# Patient Record
Sex: Female | Born: 1965 | Race: Black or African American | Hispanic: No | Marital: Single | Smoking: Current some day smoker
Health system: Southern US, Community
[De-identification: ages and names within clinical notes are randomized; demographics above are authoritative.]

## PROBLEM LIST (undated history)

## (undated) DIAGNOSIS — I10 Essential (primary) hypertension: Secondary | ICD-10-CM

## (undated) HISTORY — DX: Essential (primary) hypertension: I10

## (undated) HISTORY — PX: NO PAST SURGERIES: SHX2092

---

## 2001-11-08 ENCOUNTER — Ambulatory Visit (HOSPITAL_COMMUNITY): Admission: RE | Admit: 2001-11-08 | Discharge: 2001-11-08 | Payer: Self-pay | Admitting: Family Medicine

## 2001-11-08 ENCOUNTER — Encounter: Payer: Self-pay | Admitting: Family Medicine

## 2011-05-27 ENCOUNTER — Emergency Department (HOSPITAL_COMMUNITY)
Admission: EM | Admit: 2011-05-27 | Discharge: 2011-05-27 | Disposition: A | Discharge status: Home / Self Care | Payer: No Typology Code available for payment source | Attending: Emergency Medicine | Admitting: Emergency Medicine

## 2011-05-27 ENCOUNTER — Encounter (HOSPITAL_COMMUNITY): Payer: Self-pay | Admitting: Emergency Medicine

## 2011-05-27 ENCOUNTER — Emergency Department (HOSPITAL_COMMUNITY): Payer: No Typology Code available for payment source

## 2011-05-27 DIAGNOSIS — E041 Nontoxic single thyroid nodule: Secondary | ICD-10-CM | POA: Insufficient documentation

## 2011-05-27 DIAGNOSIS — IMO0001 Reserved for inherently not codable concepts without codable children: Secondary | ICD-10-CM | POA: Insufficient documentation

## 2011-05-27 DIAGNOSIS — M545 Low back pain, unspecified: Secondary | ICD-10-CM | POA: Insufficient documentation

## 2011-05-27 DIAGNOSIS — R42 Dizziness and giddiness: Secondary | ICD-10-CM | POA: Insufficient documentation

## 2011-05-27 DIAGNOSIS — M25559 Pain in unspecified hip: Secondary | ICD-10-CM | POA: Insufficient documentation

## 2011-05-27 DIAGNOSIS — S139XXA Sprain of joints and ligaments of unspecified parts of neck, initial encounter: Secondary | ICD-10-CM | POA: Insufficient documentation

## 2011-05-27 DIAGNOSIS — M25569 Pain in unspecified knee: Secondary | ICD-10-CM | POA: Insufficient documentation

## 2011-05-27 DIAGNOSIS — F411 Generalized anxiety disorder: Secondary | ICD-10-CM | POA: Insufficient documentation

## 2011-05-27 DIAGNOSIS — S161XXA Strain of muscle, fascia and tendon at neck level, initial encounter: Secondary | ICD-10-CM

## 2011-05-27 MED ORDER — IBUPROFEN 800 MG PO TABS: 800.0000 mg | ORAL_TABLET | Freq: Three times a day (TID) | ORAL | Status: DC

## 2011-05-27 MED ORDER — IBUPROFEN 800 MG PO TABS
ORAL_TABLET | ORAL | Status: AC
  Administered 2011-05-27: 800 mg via ORAL
  Filled 2011-05-27: qty 1

## 2011-05-27 MED ORDER — IBUPROFEN 800 MG PO TABS
800.0000 mg | ORAL_TABLET | Freq: Once | ORAL | Status: AC
  Administered 2011-05-27: 800 mg via ORAL

## 2011-05-27 NOTE — ED Notes (Signed)
Patient taken off backboard with help of dr. Manus Gunning and lisa, nt, c spine maintained. c collar still on patient at this time. Patient helped with use of bedpan.

## 2011-05-27 NOTE — ED Notes (Signed)
Patient on backboard and c collar in place at this time.
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
## 2011-05-27 NOTE — Discharge Instructions (Signed)
Cervical Sprain A cervical sprain is an injury in the neck in which the ligaments are stretched or torn. The ligaments are the tissues that hold the bones of the neck (vertebrae) in place.Cervical sprains can range from very mild to very severe. Most cervical sprains get better in 1 to 3 weeks, but it depends on the cause and extent of the injury. Severe cervical sprains can cause the neck vertebrae to be unstable. This can lead to damage of the spinal cord and can result in serious nervous system problems. Your caregiver will determine whether your cervical sprain is mild or severe. CAUSES  Severe cervical sprains may be caused by:  Contact sport injuries (football, rugby, wrestling, hockey, auto racing, gymnastics, diving, martial arts, boxing).   Motor vehicle collisions.   Whiplash injuries. This means the neck is forcefully whipped backward and forward.   Falls.  Mild cervical sprains may be caused by:   Awkward positions, such as cradling a telephone between your ear and shoulder.   Sitting in a chair that does not offer proper support.   Working at a poorly designed computer station.   Activities that require looking up or down for long periods of time.  SYMPTOMS   Pain, soreness, stiffness, or a burning sensation in the front, back, or sides of the neck. This discomfort may develop immediately after injury or it may develop slowly and not begin for 24 hours or more after an injury.   Pain or tenderness directly in the middle of the back of the neck.   Shoulder or upper back pain.   Limited ability to move the neck.   Headache.   Dizziness.   Weakness, numbness, or tingling in the hands or arms.   Muscle spasms.   Difficulty swallowing or chewing.   Tenderness and swelling of the neck.  DIAGNOSIS  Most of the time, your caregiver can diagnose this problem by taking your history and doing a physical exam. Your caregiver will ask about any known problems, such as  arthritis in the neck or a previous neck injury. X-rays may be taken to find out if there are any other problems, such as problems with the bones of the neck. However, an X-ray often does not reveal the full extent of a cervical sprain. Other tests such as a computed tomography (CT) scan or magnetic resonance imaging (MRI) may be needed. TREATMENT  Treatment depends on the severity of the cervical sprain. Mild sprains can be treated with rest, keeping the neck in place (immobilization), and pain medicines. Severe cervical sprains need immediate immobilization and an appointment with an orthopedist or neurosurgeon. Several treatment options are available to help with pain, muscle spasms, and other symptoms. Your caregiver may prescribe:  Medicines, such as pain relievers, numbing medicines, or muscle relaxants.   Physical therapy. This can include stretching exercises, strengthening exercises, and posture training. Exercises and improved posture can help stabilize the neck, strengthen muscles, and help stop symptoms from returning.   A neck collar to be worn for short periods of time. Often, these collars are worn for comfort. However, certain collars may be worn to protect the neck and prevent further worsening of a serious cervical sprain.  HOME CARE INSTRUCTIONS   Put ice on the injured area.   Put ice in a plastic bag.   Place a towel between your skin and the bag.   Leave the ice on for 15 to 20 minutes, 3 to 4 times a day.     Only take over-the-counter or prescription medicines for pain, discomfort, or fever as directed by your caregiver.   Keep all follow-up appointments as directed by your caregiver.   Keep all physical therapy appointments as directed by your caregiver.   If a neck collar is prescribed, wear it as directed by your caregiver.   Do not drive while wearing a neck collar.   Make any needed adjustments to your work station to promote good posture.   Avoid positions  and activities that make your symptoms worse.   Warm up and stretch before being active to help prevent problems.  SEEK MEDICAL CARE IF:   Your pain is not controlled with medicine.   You are unable to decrease your pain medicine over time as planned.   Your activity level is not improving as expected.  SEEK IMMEDIATE MEDICAL CARE IF:   You develop any bleeding, stomach upset, or signs of an allergic reaction to your medicine.   Your symptoms get worse.   You develop new, unexplained symptoms.   You have numbness, tingling, weakness, or paralysis in any part of your body.  MAKE SURE YOU:   Understand these instructions.   Will watch your condition.   Will get help right away if you are not doing well or get worse.  Document Released: 10/23/2006 Document Revised: 12/15/2010 Document Reviewed: 09/28/2010 [facility] Patient Information 2012 [place], [place]. Thyroid Diseases Your thyroid is a butterfly-shaped gland in your neck. It is located just above your collarbone. It is one of your endocrine glands, which make hormones. The thyroid helps set your metabolism. Metabolism is how your body gets energy from the foods you eat.  Millions of people have thyroid diseases. Women experience thyroid problems more often than men. In fact, overactive thyroid problems (hyperthyroidism) occur in 1% of all women. If you have a thyroid disease, your body may use energy more slowly or quickly than it should.  Thyroid problems also include an immune disease where your body reacts against your thyroid gland (called thyroiditis). A different problem involves lumps and bumps (called nodules) that develop in the gland. The nodules are usually, but not always, noncancerous. THE MOST COMMON THYROID PROBLEMS AND CAUSES ARE DISCUSSED BELOW There are many causes for thyroid problems. Treatment depends upon the exact diagnosis and includes trying to reset your body's metabolism to a normal  rate. Hyperthyroidism Too much thyroid hormone from an overactive thyroid gland is called hyperthyroidism. In hyperthyroidism, the body's metabolism speeds up. One of the most frequent forms of hyperthyroidism is known as Graves' disease. Graves' disease tends to run in families. Although Graves' is thought to be caused by a problem with the immune system, the exact nature of the genetic problem is unknown. Hypothyroidism Too little thyroid hormone from an underactive thyroid gland is called hypothyroidism. In hypothyroidism, the body's metabolism is slowed. Several things can cause this condition. Most causes affect the thyroid gland directly and hurt its ability to make enough hormone.  Rarely, there may be a pituitary gland tumor (located near the base of the brain). The tumor can block the pituitary from producing thyroid-stimulating hormone (TSH). Your body makes TSH to stimulate the thyroid to work properly. If the pituitary does not make enough TSH, the thyroid fails to make enough hormones needed for good health. Whether the problem is caused by thyroid conditions or by the pituitary gland, the result is that the thyroid is not making enough hormones. Hypothyroidism causes many physical and mental processes to become  sluggish. The body consumes less oxygen and produces less body heat. Thyroid Nodules A thyroid nodule is a small swelling or lump in the thyroid gland. They are common. These nodules represent either a growth of thyroid tissue or a fluid-filled cyst. Both form a lump in the thyroid gland. Almost half of all people will have tiny thyroid nodules at some point in their lives. Typically, these are not noticeable until they become large and affect normal thyroid size. Larger nodules that are greater than a half inch across (about 1 centimeter) occur in about 5 percent of people. Although most nodules are not cancerous, people who have them should seek medical care to rule out cancer. Also,  some thyroid nodules may produce too much thyroid hormone or become too large. Large nodules or a large gland can interfere with breathing or swallowing or may cause neck discomfort. Other problems Other thyroid problems include cancer and thyroiditis. Thyroiditis is a malfunction of the body's immune system. Normally, the immune system works to defend the body against infection and other problems. When the immune system is not working properly, it may mistakenly attack normal cells, tissues, and organs. Examples of autoimmune diseases are Hashimoto's thyroiditis (which causes low thyroid function) and Graves' disease (which causes excess thyroid function). SYMPTOMS  Symptoms vary greatly depending upon the exact type of problem with the thyroid. Hyperthyroidism-is when your thyroid is too active and makes more thyroid hormone than your body needs. The most common cause is Graves' Disease. Too much thyroid hormone can cause some or all of the following symptoms:  Anxiety.   Irritability.   Difficulty sleeping.   Fatigue.   A rapid or irregular heartbeat.   A fine tremor of your hands or fingers.   An increase in perspiration.   Sensitivity to heat.   Weight loss, despite normal food intake.   Brittle hair.   Enlargement of your thyroid gland (goiter).   Light menstrual periods.   Frequent bowel movements.  Graves' disease can specifically cause eye and skin problems. The skin problems involve reddening and swelling of the skin, often on your shins and on the top of your feet. Eye problems can include the following:  Excess tearing and sensation of grit or sand in either or both eyes.   Reddened or inflamed eyes.   Widening of the space between your eyelids.   Swelling of the lids and tissues around the eyes.   Light sensitivity.   Ulcers on the cornea.   Double vision.   Limited eye movements.   Blurred or reduced vision.  Hypothyroidism- is when your thyroid gland  is not active enough. This is more common than hyperthyroidism. Symptoms can vary a lot depending of the severity of the hormone deficiency. Symptoms may develop over a long period of time and can include several of the following:  Fatigue.   Sluggishness.   Increased sensitivity to cold.   Constipation.   Pale, dry skin.   A puffy face.   Hoarse voice.   High blood cholesterol level.   Unexplained weight gain.   Muscle aches, tenderness and stiffness.   Pain, stiffness or swelling in your joints.   Muscle weakness.   Heavier than normal menstrual periods.   Brittle fingernails and hair.   Depression.  Thyroid Nodules - most do not cause signs or symptoms. Occasionally, some may become so large that you can feel or even see the swelling at the base of your neck. You may realize a  lump or swelling is there when you are shaving or putting on makeup. Men might become aware of a nodule when shirt collars suddenly feel too tight. Some nodules produce too much thyroid hormone. This can produce the same symptoms as hyperthyroidism (see above). Thyroid nodules are seldom cancerous. However, a nodule is more likely to be malignant (cancerous) if it:  Grows quickly or feels hard.   Causes you to become hoarse or to have trouble swallowing or breathing.   Causes enlarged lymph nodes under your jaw or in your neck.  DIAGNOSIS  Because there are so many possible thyroid conditions, your caregiver may ask for a number of tests. They will do this in order to narrow down the exact diagnosis. These tests can include:  Blood and antibody tests.   Special thyroid scans using small, safe amounts of radioactive iodine.   Ultrasound of the thyroid gland (particularly if there is a nodule or lump).   Biopsy. This is usually done with a special needle. A needle biopsy is a procedure to obtain a sample of cells from the thyroid. The tissue will be tested in a lab and examined under a  microscope.  TREATMENT  Treatment depends on the exact diagnosis. Hyperthyroidism  Beta-blockers help relieve many of the symptoms.   Anti-thyroid medications prevent the thyroid from making excess hormones.   Radioactive iodine treatment can destroy overactive thyroid cells. The iodine can permanently decrease the amount of hormone produced.   Surgery to remove the thyroid gland.   Treatments for eye problems that come from Graves' disease also include medications and special eye surgery, if felt to be appropriate.  Hypothyroidism Thyroid replacement with levothyroxine is the mainstay of treatment. Treatment with thyroid replacement is usually lifelong and will require monitoring and adjustment from time to time. Thyroid Nodules  Watchful waiting. If a small nodule causes no symptoms or signs of cancer on biopsy, then no treatment may be chosen at first. Re-exam and re-checking blood tests would be the recommended follow-up.   Anti-thyroid medications or radioactive iodine treatment may be recommended if the nodules produce too much thyroid hormone (see Treatment for Hyperthyroidism above).   Alcohol ablation. Injections of small amounts of ethyl alcohol (ethanol) can cause a non-cancerous nodule to shrink in size.   Surgery (see Treatment for Hyperthyroidism above).  HOME CARE INSTRUCTIONS   Take medications as instructed.   Follow through on recommended testing.  SEEK MEDICAL CARE IF:   You feel that you are developing symptoms of Hyperthyroidism or Hypothyroidism as described above.   You develop a new lump/nodule in the neck/thyroid area that you had not noticed before.   You feel that you are having side effects from medicines prescribed.   You develop trouble breathing or swallowing.  SEEK IMMEDIATE MEDICAL CARE IF:   You develop a fever of 102 F (38.9 C) or higher.   You develop severe sweating.   You develop palpitations and/or rapid heart beat.   You develop  shortness of breath.   You develop nausea and vomiting.   You develop extreme shakiness.   You develop agitation.   You develop lightheadedness or have a fainting episode.  Document Released: 10/23/2006 Document Revised: 12/15/2010 Document Reviewed: 10/23/2006 [facility] Patient Information 2012 [place], [place].

## 2011-05-27 NOTE — ED Notes (Signed)
Patient states that she is going through menopause and denies being sexually active.
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
## 2011-06-02 ENCOUNTER — Encounter (HOSPITAL_COMMUNITY): Payer: Self-pay | Admitting: *Deleted

## 2011-06-02 ENCOUNTER — Emergency Department (HOSPITAL_COMMUNITY)
Admission: EM | Admit: 2011-06-02 | Discharge: 2011-06-02 | Disposition: A | Discharge status: Home / Self Care | Payer: No Typology Code available for payment source | Attending: Emergency Medicine | Admitting: Emergency Medicine

## 2011-06-02 DIAGNOSIS — M549 Dorsalgia, unspecified: Secondary | ICD-10-CM

## 2011-06-02 DIAGNOSIS — M545 Low back pain, unspecified: Secondary | ICD-10-CM | POA: Insufficient documentation

## 2011-06-02 MED ORDER — CYCLOBENZAPRINE HCL 10 MG PO TABS: 10.0000 mg | ORAL_TABLET | Freq: Three times a day (TID) | ORAL | Status: AC | PRN

## 2011-06-02 MED ORDER — HYDROCODONE-ACETAMINOPHEN 5-325 MG PO TABS: ORAL_TABLET | ORAL | Status: AC
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
## 2011-06-02 NOTE — ED Provider Notes (Signed)
History     CSN: [id number]  Arrival date & time 06/02/11  1447   First MD Initiated Contact with Patient 06/02/11 1524      Chief Complaint  Patient presents with  . Back Pain    (Consider location/radiation/quality/duration/timing/severity/associated sxs/prior treatment) Patient is a 46 y.o. female presenting with back pain. The history is provided by the patient.  Back Pain  This is a chronic problem. The current episode started more than 2 days ago. The problem occurs constantly. The problem has been gradually worsening. The pain is associated with an MCA. The pain is present in the lumbar spine. The quality of the pain is described as shooting and aching. The pain radiates to the left thigh. The pain is moderate. The symptoms are aggravated by bending, twisting and certain positions. The pain is the same all the time. Associated symptoms include leg pain. Pertinent negatives include no chest pain, no fever, no numbness, no abdominal pain, no abdominal swelling, no bowel incontinence, no perianal numbness, no bladder incontinence, no dysuria, no pelvic pain, no paresthesias, no paresis, no tingling and no weakness. She has tried analgesics for the symptoms. The treatment provided mild relief.    History reviewed. No pertinent past medical history.  History reviewed. No pertinent past surgical history.  No family history on file.  History  Substance Use Topics  . Smoking status: Current Everyday Smoker    Types: Cigarettes  . Smokeless tobacco: Not on file  . Alcohol Use: Yes    OB History    Grav Para Term Preterm Abortions TAB SAB Ect Mult Living                  Review of Systems  Constitutional: Negative for fever.  Respiratory: Negative for chest tightness and shortness of breath.   Cardiovascular: Negative for chest pain.  Gastrointestinal: Negative for vomiting, abdominal pain, constipation and bowel incontinence.  Genitourinary: Negative for bladder  incontinence, dysuria, hematuria, flank pain, decreased urine volume, difficulty urinating and pelvic pain.       Perineal numbness or incontinence of urine or feces  Musculoskeletal: Positive for back pain. Negative for joint swelling.  Skin: Negative for rash.  Neurological: Negative for dizziness, tingling, weakness, numbness and paresthesias.  All other systems reviewed and are negative.    Allergies  Review of patient's allergies indicates no known allergies.  Home Medications   Current Outpatient Rx  Name Route Sig Dispense Refill  . B-12 PO Oral Take 1 tablet by mouth daily.    Marland Kitchen FERROUS SULFATE 325 (65 FE) MG PO TABS Oral Take 325 mg by mouth daily.    . IBUPROFEN 200 MG PO TABS Oral Take 400 mg by mouth as needed. For pain      BP 119/88  Pulse 74  Temp(Src) 97.9 F (36.6 C) (Oral)  Resp 20  Ht 5\' 5"  (1.651 m)  Wt 120 lb (54.432 kg)  BMI 19.97 kg/m2  SpO2 100%  Physical Exam  Nursing note and vitals reviewed. Constitutional: She is oriented to person, place, and time. She appears well-developed and well-nourished. No distress.  HENT:  Head: Normocephalic and atraumatic.  Neck: Normal range of motion. Neck supple.  Cardiovascular: Normal rate, regular rhythm and intact distal pulses.   No murmur heard. Pulmonary/Chest: Effort normal and breath sounds normal.  Musculoskeletal: She exhibits tenderness. She exhibits no edema.       Lumbar back: She exhibits tenderness and pain. She exhibits normal range of motion, no  swelling, no deformity, no laceration and normal pulse.       Back:  Neurological: She is alert and oriented to person, place, and time. No cranial nerve deficit or sensory deficit. She exhibits normal muscle tone. Coordination and gait normal.  Reflex Scores:      Patellar reflexes are 2+ on the right side and 2+ on the left side.      Achilles reflexes are 2+ on the right side and 2+ on the left side. Skin: Skin is warm and dry.    ED Course    Procedures (including critical care time)       MDM     Previous medical charts, nursing notes and vitals signs from this visit were reviewed by me   All laboratory results and/or imaging results performed on this visit, if applicable, were reviewed by me and discussed with the patient and/or parent as well as recommendation for follow-up    MEDICATIONS GIVEN IN ED:  norco po, flexeril   Patient has ttp of the left lumbar and thoracic paraspinal muscles and left SI joint.  No focal neuro deficits on exam.  Ambulates with a steady gait.   Previous x-rays were reviewed by me.      PRESCRIPTIONS GIVEN AT DISCHARGE:  norco #20, flexeril   Pt stable in ED with no significant deterioration in condition. Pt feels improved after observation and/or treatment in ED. Patient / Family / Caregiver understand and agree with initial ED impression and plan with expectations set for ED visit.  Patient agrees to return to ED for any worsening symptoms       Rhyker Silversmith L. Naythen Heikkila, [place] 06/08/11 1344

## 2011-06-02 NOTE — Discharge Instructions (Signed)

## 2011-06-08 NOTE — ED Provider Notes (Signed)
Medical screening examination/treatment/procedure(s) were performed by non-physician practitioner and as supervising physician I was immediately available for consultation/collaboration.   Dayton Bailiff, MD 06/08/11 2005

## 2011-08-28 ENCOUNTER — Encounter (HOSPITAL_COMMUNITY): Payer: Self-pay | Admitting: *Deleted

## 2011-08-28 ENCOUNTER — Emergency Department (HOSPITAL_COMMUNITY)
Admission: EM | Admit: 2011-08-28 | Discharge: 2011-08-28 | Disposition: A | Discharge status: Home / Self Care | Payer: Self-pay | Attending: Emergency Medicine | Admitting: Emergency Medicine

## 2011-08-28 ENCOUNTER — Emergency Department (HOSPITAL_COMMUNITY): Payer: Self-pay

## 2011-08-28 DIAGNOSIS — S93409A Sprain of unspecified ligament of unspecified ankle, initial encounter: Secondary | ICD-10-CM | POA: Insufficient documentation

## 2011-08-28 DIAGNOSIS — F17200 Nicotine dependence, unspecified, uncomplicated: Secondary | ICD-10-CM | POA: Insufficient documentation

## 2011-08-28 DIAGNOSIS — X500XXA Overexertion from strenuous movement or load, initial encounter: Secondary | ICD-10-CM | POA: Insufficient documentation

## 2011-08-28 MED ORDER — IBUPROFEN 600 MG PO TABS: 600.0000 mg | ORAL_TABLET | Freq: Three times a day (TID) | ORAL | Status: AC | PRN

## 2011-08-28 MED ORDER — HYDROCODONE-ACETAMINOPHEN 5-325 MG PO TABS: 1.0000 | ORAL_TABLET | ORAL | Status: AC | PRN

## 2011-08-28 MED ORDER — HYDROCODONE-ACETAMINOPHEN 5-325 MG PO TABS
1.0000 | ORAL_TABLET | Freq: Once | ORAL | Status: AC
  Administered 2011-08-28: 1 via ORAL
  Filled 2011-08-28: qty 1

## 2011-08-28 NOTE — ED Notes (Signed)
Pain rt ankle , twisted ankle when stepping off porch.  Swelling present

## 2011-08-28 NOTE — ED Notes (Signed)
Pt twisted right ankle yesterday while stepping onto patio porch, noted swelling to right ankle

## 2011-08-29 ENCOUNTER — Emergency Department (HOSPITAL_COMMUNITY)
Admission: EM | Admit: 2011-08-29 | Discharge: 2011-08-29 | Disposition: A | Discharge status: Home / Self Care | Payer: Self-pay | Attending: Orthopaedic Surgery | Admitting: Orthopaedic Surgery

## 2011-08-29 ENCOUNTER — Encounter (HOSPITAL_COMMUNITY): Payer: Self-pay | Admitting: *Deleted

## 2011-08-29 DIAGNOSIS — S8263XA Displaced fracture of lateral malleolus of unspecified fibula, initial encounter for closed fracture: Secondary | ICD-10-CM | POA: Insufficient documentation

## 2011-08-29 DIAGNOSIS — X58XXXA Exposure to other specified factors, initial encounter: Secondary | ICD-10-CM | POA: Insufficient documentation

## 2011-08-29 NOTE — ED Notes (Signed)
Dr. Hilda Lias assessed pt and is typing d/c instructions.

## 2011-08-29 NOTE — Consult Note (Signed)
Reason for Consult:Fracture of the right ankle Referring Physician: ER  Courtney Valdez is an 46 y.o. female.  HPI: She fell and hurt her right lateral ankle. X-rays show a small avulsion fracture of the distal lateral malleolus. She has no other injury. She has a CAM walker and crutches.  She was given pain medicine before.    History reviewed. No pertinent past medical history.  History reviewed. No pertinent past surgical history.  History reviewed. No pertinent family history.  Social History:  reports that she has been smoking Cigarettes.  She does not have any smokeless tobacco history on file. She reports that she drinks alcohol. She reports that she uses illicit drugs (Marijuana).  Allergies: No Known Allergies  Medications: I have reviewed the patient's current medications.  No results found for this or any previous visit (from the past 48 hour(s)).  Dg Ankle Complete Right  08/28/2011  *RADIOLOGY REPORT*  Clinical Data: Lateral pain and swelling, rolled ankle yesterday  RIGHT ANKLE - COMPLETE 3+ VIEW  Comparison: None  Findings: Significant soft tissue swelling right ankle. Minimally displaced fracture at tip of lateral malleolus. No additional fracture, dislocation, or bone destruction. Ankle mortise intact.  IMPRESSION: Minimally displaced fracture at tip of lateral malleolus.   Original Report Authenticated By: Lollie Marrow, M.D. ( 08/28/2011 11:25:46 )     Review of Systems  Musculoskeletal: Positive for joint pain (right ankle pain laterally.) and falls.  All other systems reviewed and are negative.   Blood pressure 131/74, pulse 56, temperature 98.4 F (36.9 C), temperature source Oral, resp. rate 18, height 5\' 5"  (1.651 m), weight 56.7 kg (125 lb), SpO2 100.00%. Physical Exam  Constitutional: She is oriented to person, place, and time. She appears well-developed and well-nourished.  HENT:  Head: Normocephalic and atraumatic.  Eyes: Conjunctivae are normal. Pupils  are equal, round, and reactive to light.  Neck: Normal range of motion. Neck supple.  Cardiovascular: Normal rate, regular rhythm, normal heart sounds and intact distal pulses.   Respiratory: Effort normal and breath sounds normal.  GI: Soft. Bowel sounds are normal.  Musculoskeletal: She exhibits edema and tenderness (pain right lateral ankle with lateral ecchymosis and decreased motion.  The ankle is stable.).       Feet:  Neurological: She is alert and oriented to person, place, and time. She has normal reflexes.  Skin: Skin is warm.  Psychiatric: She has a normal mood and affect. Her behavior is normal. Judgment and thought content normal.    Assessment/Plan: Avulsion fracture of the right lateral malleolus.  Continue the CAM walker and crutches and soaking.  See in office next week.  Courtney Valdez 08/29/2011, 3:44 PM

## 2011-08-29 NOTE — ED Notes (Signed)
Has injury to rt ankle . Told to come to ER to see Dr Hilda Lias.

## 2011-08-29 NOTE — ED Notes (Signed)
Pt reports was evaluated here yesterday and was told had fractured ankle.  Pt was told to come here today by Dr. Hilda Lias and he will see her in ED.  Notified Dr. Sanjuan Dame office staff that pt is here.  Pt has palpable pedal pulse, r ankle and foot swollen and bruised

## 2011-08-29 NOTE — ED Notes (Signed)
Phoned Dr. Sanjuan Dame office to see when Dr. Hilda Lias would be in to see pt. Nurse stated he would probably be over in next 30 minutes.

## 2011-08-30 NOTE — ED Provider Notes (Signed)
History     CSN: [id number]  Arrival date & time 08/28/11  1042   First MD Initiated Contact with Patient 08/28/11 1107      Chief Complaint  Patient presents with  . Ankle Pain    (Consider location/radiation/quality/duration/timing/severity/associated sxs/prior treatment) HPI Comments: Courtney Valdez presents with increased pain, swelling and bruising along her right lateral ankle since yesterday after inverting her ankle when stepping onto her porch.  She has been able to weight bear but with discomfort.  She has used ice and elevation along with ibuprofen with no relief of her symptoms.  The history is provided by the patient.    History reviewed. No pertinent past medical history.  History reviewed. No pertinent past surgical history.  History reviewed. No pertinent family history.  History  Substance Use Topics  . Smoking status: Current Everyday Smoker    Types: Cigarettes  . Smokeless tobacco: Not on file  . Alcohol Use: Yes    OB History    Grav Para Term Preterm Abortions TAB SAB Ect Mult Living                  Review of Systems  Musculoskeletal: Positive for joint swelling and arthralgias.  Skin: Negative for wound.  Neurological: Negative for weakness and numbness.    Allergies  Review of patient's allergies indicates no known allergies.  Home Medications   Current Outpatient Rx  Name Route Sig Dispense Refill  . B-12 PO Oral Take 1 tablet by mouth daily.    Marland Kitchen FERROUS SULFATE 325 (65 FE) MG PO TABS Oral Take 325 mg by mouth daily.    . IBUPROFEN 200 MG PO TABS Oral Take 400 mg by mouth as needed. For pain    . HYDROCODONE-ACETAMINOPHEN 5-325 MG PO TABS Oral Take 1 tablet by mouth every 4 (four) hours as needed for pain. 15 tablet 0  . IBUPROFEN 600 MG PO TABS Oral Take 1 tablet (600 mg total) by mouth every 8 (eight) hours as needed for pain. 20 tablet 0    BP 116/102  Pulse 75  Temp 98.3 F (36.8 C) (Oral)  Resp 20  Ht 5\' 5"  (1.651 m)  Wt  125 lb (56.7 kg)  BMI 20.80 kg/m2  SpO2 100%  Physical Exam  Nursing note and vitals reviewed. Constitutional: She appears well-developed and well-nourished.  HENT:  Head: Normocephalic.  Neck: Neck supple.  Cardiovascular: Normal rate and intact distal pulses.  Exam reveals no decreased pulses.   Pulses:      Dorsalis pedis pulses are 2+ on the right side, and 2+ on the left side.       Posterior tibial pulses are 2+ on the right side, and 2+ on the left side.  Musculoskeletal: She exhibits edema and tenderness.       Right ankle: She exhibits decreased range of motion, swelling and ecchymosis. She exhibits normal pulse. tenderness. Lateral malleolus tenderness found. No head of 5th metatarsal and no proximal fibula tenderness found. Achilles tendon normal.  Neurological: She is alert. No sensory deficit.  Skin: Skin is warm, dry and intact.    ED Course  Procedures (including critical care time)  Labs Reviewed - No data to display Dg Ankle Complete Right  08/28/2011  *RADIOLOGY REPORT*  Clinical Data: Lateral pain and swelling, rolled ankle yesterday  RIGHT ANKLE - COMPLETE 3+ VIEW  Comparison: None  Findings: Significant soft tissue swelling right ankle. Minimally displaced fracture at tip of lateral malleolus. No additional  fracture, dislocation, or bone destruction. Ankle mortise intact.  IMPRESSION: Minimally displaced fracture at tip of lateral malleolus.   Original Report Authenticated By: Lollie Marrow, M.D. ( 08/28/2011 11:25:46 )      1. Severe ankle sprain    Cam walker, crutches provided patient.   MDM  RICE, cam walker, crutches.  Prescribed hydrocodone and ibuprofen.  Call placed to Dr. Hilda Lias who agrees with plan and will see pt tomorrow for recheck.  Caution given regarding sedation with pain meds.  Work note given.  Pt understands plan.  Cap refill normal,  Pain some better after cam walker application.  Patients labs and/or radiological studies were reviewed  during the medical decision making and disposition process.          Burgess Amor, [place] 08/30/11 [phone]

## 2011-09-19 ENCOUNTER — Ambulatory Visit (HOSPITAL_COMMUNITY)
Admission: RE | Admit: 2011-09-19 | Discharge: 2011-09-19 | Disposition: A | Discharge status: Home / Self Care | Payer: Self-pay | Source: Ambulatory Visit | Attending: Orthopaedic Surgery | Admitting: Orthopaedic Surgery

## 2011-09-19 ENCOUNTER — Emergency Department (HOSPITAL_COMMUNITY): Admission: EM | Admit: 2011-09-19 | Discharge: 2011-09-19 | Discharge status: Against Medical Advice | Payer: Self-pay

## 2011-09-19 ENCOUNTER — Other Ambulatory Visit (HOSPITAL_COMMUNITY): Payer: Self-pay | Admitting: Orthopaedic Surgery

## 2011-09-19 DIAGNOSIS — T148XXA Other injury of unspecified body region, initial encounter: Secondary | ICD-10-CM

## 2011-09-19 DIAGNOSIS — Z4789 Encounter for other orthopedic aftercare: Secondary | ICD-10-CM | POA: Insufficient documentation

## 2015-05-02 DIAGNOSIS — Z0130 Encounter for examination of blood pressure without abnormal findings: Secondary | ICD-10-CM

## 2015-05-02 NOTE — Congregational Nurse Program (Signed)
Congregational Nurse Program Note  Date of Encounter: 05/02/2015  Past Medical History: No past medical history on file.  Encounter Details:     CNP Questionnaire - 05/02/15 1626    Patient Demographics   Is this a new or existing patient? New   Patient is considered a/an Not Applicable   Race African-American/Black   Patient Assistance   Location of Patient Assistance Not Applicable   Patient's financial/insurance status Self-Pay   Uninsured Patient Yes   Interventions Follow-up/Education/Support provided after completed appt.   Patient referred to apply for the following financial assistance Marketplace or to a Navigator   Food insecurities addressed Not Applicable   Transportation assistance No   Assistance securing medications No   Biomedical engineerducational health offerings Safety;Not Applicable   Encounter Details   Primary purpose of visit Education/Health Concerns   Was an Emergency Department visit averted? Not Applicable   Does patient have a medical provider? Yes   Patient referred to Follow up with established PCP   Was a mental health screening completed? (GAINS tool) No   Does patient have dental issues? No   Does patient have vision issues? No   Since previous encounter, have you referred patient for abnormal blood pressure that resulted in a new diagnosis or medication change? No   Since previous encounter, have you referred patient for abnormal blood glucose that resulted in a new diagnosis or medication change? No   For Abstraction Use Only   Does patient have insurance? No       S- Ms. Labarre Had her blood pressure and blood sugar checked today [facility] CN at [facility] .  O- This was Ms Daltons first visit to the [facility] and her blood pressure 117/83 and BS 88  A- Both her blood pressure and blood sugar levels were within normal limits  P- Plan is to get some insurance and find a PCP ,[place] is new to the [place] .

## 2015-06-29 ENCOUNTER — Encounter (HOSPITAL_COMMUNITY): Payer: Self-pay | Admitting: Certified Registered Nurse Anesthetist

## 2015-06-29 ENCOUNTER — Inpatient Hospital Stay: Admit: 2015-06-29 | Payer: Self-pay | Admitting: Orthopedic Surgery

## 2015-06-29 SURGERY — CLOSED REDUCTION, FRACTURE, METACARPAL BONE
Anesthesia: General | Laterality: Left

## 2015-07-17 DIAGNOSIS — Z0000 Encounter for general adult medical examination without abnormal findings: Secondary | ICD-10-CM

## 2015-07-17 NOTE — Congregational Nurse Program (Signed)
Congregational Nurse Program Note  Date of Encounter: 07/17/2015  Past Medical History: No past medical history on file.  Encounter Details:     CNP Questionnaire - 07/17/15 1252    Patient Demographics   Is this a new or existing patient? Existing   Patient is considered a/an Not Applicable   Race African-American/Black   Patient Assistance   Location of Patient Assistance Not Applicable   Patient's financial/insurance status Affordable Care Act   Uninsured Patient Yes   Interventions Not Applicable   Patient referred to apply for the following financial assistance Marketplace or to a Navigator   Food insecurities addressed Not Applicable   Transportation assistance No   Assistance securing medications No   Educational health offerings Not Applicable   Encounter Details   Primary purpose of visit Education/Health Concerns   Was an Emergency Department visit averted? No   Does patient have a medical provider? No   Patient referred to Not Applicable   Was a mental health screening completed? (GAINS tool) No   Does patient have dental issues? No   Does patient have vision issues? No   Does your patient have an abnormal blood pressure today? No   Since previous encounter, have you referred patient for abnormal blood pressure that resulted in a new diagnosis or medication change? No   Does your patient have an abnormal blood glucose today? No   Since previous encounter, have you referred patient for abnormal blood glucose that resulted in a new diagnosis or medication change? No   Was there a life-saving intervention made? No       CN Note   Ms Freida BusmanDalton [facility] by the [facility] at [facility]  O- Ms Freida BusmanDalton had no complains A- Checked Ms. Daltons vital signs and took down medical history  P- Blood pressure 117/83 Pulse 69  Her blood sugar was 88

## 2016-08-16 ENCOUNTER — Encounter (HOSPITAL_COMMUNITY): Payer: Self-pay | Admitting: Emergency Medicine

## 2016-08-16 ENCOUNTER — Emergency Department (HOSPITAL_COMMUNITY): Payer: No Typology Code available for payment source

## 2016-08-16 ENCOUNTER — Emergency Department (HOSPITAL_COMMUNITY)
Admission: EM | Admit: 2016-08-16 | Discharge: 2016-08-17 | Disposition: A | Discharge status: Home / Self Care | Payer: No Typology Code available for payment source | Attending: Emergency Medicine | Admitting: Emergency Medicine

## 2016-08-16 DIAGNOSIS — M542 Cervicalgia: Secondary | ICD-10-CM | POA: Insufficient documentation

## 2016-08-16 DIAGNOSIS — Y92410 Unspecified street and highway as the place of occurrence of the external cause: Secondary | ICD-10-CM | POA: Insufficient documentation

## 2016-08-16 DIAGNOSIS — R52 Pain, unspecified: Secondary | ICD-10-CM

## 2016-08-16 DIAGNOSIS — M25512 Pain in left shoulder: Secondary | ICD-10-CM

## 2016-08-16 DIAGNOSIS — Y939 Activity, unspecified: Secondary | ICD-10-CM | POA: Diagnosis not present

## 2016-08-16 DIAGNOSIS — Y999 Unspecified external cause status: Secondary | ICD-10-CM | POA: Insufficient documentation

## 2016-08-16 DIAGNOSIS — R51 Headache: Secondary | ICD-10-CM | POA: Diagnosis not present

## 2016-08-16 DIAGNOSIS — F17210 Nicotine dependence, cigarettes, uncomplicated: Secondary | ICD-10-CM | POA: Insufficient documentation

## 2016-08-16 LAB — CBC WITH DIFFERENTIAL/PLATELET
HCT: 41.9 % (ref 36.0–46.0)
Hemoglobin: 15 g/dL (ref 12.0–15.0)
MCH: 30.5 pg (ref 26.0–34.0)
MCHC: 35.8 g/dL (ref 30.0–36.0)
MCV: 85.2 fL (ref 78.0–100.0)
Platelets: 107 10*3/uL — ABNORMAL LOW (ref 150–400)
RBC: 4.92 MIL/uL (ref 3.87–5.11)
RDW: 13.5 % (ref 11.5–15.5)
WBC: 6.4 10*3/uL (ref 4.0–10.5)

## 2016-08-16 LAB — BASIC METABOLIC PANEL
Anion gap: 12 (ref 5–15)
BUN: <5 mg/dL — ABNORMAL LOW (ref 6–20)
CO2: 20 mmol/L — ABNORMAL LOW (ref 22–32)
Calcium: 9.4 mg/dL (ref 8.9–10.3)
Chloride: 109 mmol/L (ref 101–111)
Creatinine, Ser: 0.63 mg/dL (ref 0.44–1.00)
GFR calc Af Amer: >60 mL/min (ref >60)
GFR calc non Af Amer: >60 mL/min (ref >60)
Glucose, Bld: 103 mg/dL — ABNORMAL HIGH (ref 65–99)
Potassium: 3.9 mmol/L (ref 3.5–5.1)
Sodium: 141 mmol/L (ref 135–145)

## 2016-08-16 MED ORDER — MORPHINE SULFATE (PF) 4 MG/ML IV SOLN
2.0000 mg | Freq: Once | INTRAVENOUS | Status: AC
Start: 2016-08-16 — End: 2016-08-16
  Administered 2016-08-16: 2 mg via INTRAVENOUS
  Filled 2016-08-16: qty 1

## 2016-08-16 MED ORDER — MORPHINE SULFATE (PF) 4 MG/ML IV SOLN
2.0000 mg | Freq: Once | INTRAVENOUS | Status: AC
  Administered 2016-08-17: 2 mg via INTRAVENOUS
  Filled 2016-08-16: qty 1

## 2016-08-16 MED ORDER — METHOCARBAMOL 500 MG PO TABS: 500.0000 mg | ORAL_TABLET | Freq: Two times a day (BID) | ORAL | 0 refills | Status: DC

## 2016-08-16 NOTE — ED Notes (Signed)
On way to XR.  Pt logrolled.  Back board removed.

## 2016-08-16 NOTE — ED Notes (Signed)
On way to CT 
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
## 2016-08-16 NOTE — Discharge Instructions (Signed)
Please read attached information regarding your condition. Take anti-inflammatories and muscle relaxer as needed. Follow-up with PCP for further evaluation if symptoms persist. Return to ED for worsening pain, increased chest pain, trouble breathing, numbness, weakness, trouble walking, additional injury or falls.

## 2016-08-17 NOTE — ED Notes (Signed)
PT states understanding of care given, follow up care, and medication prescribed. PT ambulated from ED to car with a steady gait. 

## 2019-01-27 ENCOUNTER — Ambulatory Visit: Payer: Self-pay | Attending: Internal Medicine

## 2019-01-27 DIAGNOSIS — Z20822 Contact with and (suspected) exposure to covid-19: Secondary | ICD-10-CM | POA: Insufficient documentation

## 2019-01-28 LAB — NOVEL CORONAVIRUS, NAA: SARS-CoV-2, NAA: NOT DETECTED

## 2019-06-05 ENCOUNTER — Other Ambulatory Visit: Payer: Self-pay | Admitting: Internal Medicine

## 2019-06-05 DIAGNOSIS — E2839 Other primary ovarian failure: Secondary | ICD-10-CM

## 2019-06-12 ENCOUNTER — Encounter: Payer: Self-pay | Admitting: Gastroenterology

## 2019-07-08 ENCOUNTER — Other Ambulatory Visit: Payer: Self-pay | Admitting: Internal Medicine

## 2019-07-08 DIAGNOSIS — Z1231 Encounter for screening mammogram for malignant neoplasm of breast: Secondary | ICD-10-CM

## 2019-08-19 ENCOUNTER — Ambulatory Visit (AMBULATORY_SURGERY_CENTER): Payer: Self-pay

## 2019-08-19 ENCOUNTER — Other Ambulatory Visit: Payer: Self-pay

## 2019-08-19 VITALS — Ht 65.0 in | Wt 120.2 lb

## 2019-08-19 DIAGNOSIS — Z800 Family history of malignant neoplasm of digestive organs: Secondary | ICD-10-CM

## 2019-08-19 DIAGNOSIS — Z1211 Encounter for screening for malignant neoplasm of colon: Secondary | ICD-10-CM

## 2019-08-19 MED ORDER — NA SULFATE-K SULFATE-MG SULF 17.5-3.13-1.6 GM/177ML PO SOLN: 1.0000 | Freq: Once | ORAL | 0 refills | Status: AC

## 2019-08-19 NOTE — Progress Notes (Signed)
No allergies to soy or egg Pt is not on blood thinners or diet pills   Denies issues with sedation/intubation because she has had no surgeries. Denies atrial flutter/fib   Denies constipation   Emmi instructions given to pt  Pt is aware of Covid safety and care partner requirements.

## 2019-08-21 ENCOUNTER — Ambulatory Visit (INDEPENDENT_AMBULATORY_CARE_PROVIDER_SITE_OTHER): Payer: 59

## 2019-08-21 DIAGNOSIS — Z1159 Encounter for screening for other viral diseases: Secondary | ICD-10-CM

## 2019-08-21 LAB — SARS CORONAVIRUS 2 (TAT 6-24 HRS): SARS Coronavirus 2: NEGATIVE

## 2019-08-22 ENCOUNTER — Ambulatory Visit
Admission: RE | Admit: 2019-08-22 | Discharge: 2019-08-22 | Disposition: A | Discharge status: Home / Self Care | Payer: 59 | Source: Ambulatory Visit | Attending: Internal Medicine | Admitting: Internal Medicine

## 2019-08-22 ENCOUNTER — Other Ambulatory Visit: Payer: Self-pay

## 2019-08-22 DIAGNOSIS — Z1231 Encounter for screening mammogram for malignant neoplasm of breast: Secondary | ICD-10-CM

## 2019-08-22 DIAGNOSIS — E2839 Other primary ovarian failure: Secondary | ICD-10-CM

## 2019-08-25 ENCOUNTER — Other Ambulatory Visit: Payer: Self-pay | Admitting: Internal Medicine

## 2019-08-25 ENCOUNTER — Ambulatory Visit (AMBULATORY_SURGERY_CENTER): Payer: 59 | Admitting: Gastroenterology

## 2019-08-25 ENCOUNTER — Other Ambulatory Visit: Payer: Self-pay

## 2019-08-25 ENCOUNTER — Encounter: Payer: Self-pay | Admitting: Gastroenterology

## 2019-08-25 VITALS — BP 158/84 | HR 71 | Temp 97.1°F | Resp 15 | Ht 65.0 in | Wt 120.0 lb

## 2019-08-25 DIAGNOSIS — R928 Other abnormal and inconclusive findings on diagnostic imaging of breast: Secondary | ICD-10-CM

## 2019-08-25 DIAGNOSIS — Z1211 Encounter for screening for malignant neoplasm of colon: Secondary | ICD-10-CM

## 2019-08-25 MED ORDER — SODIUM CHLORIDE 0.9 % IV SOLN: 500.0000 mL | Freq: Once | INTRAVENOUS | Status: DC

## 2019-08-25 NOTE — Patient Instructions (Signed)
Resume previous diet and medications. ° °Repeat colonoscopy in 10 years. ° ° °YOU HAD AN ENDOSCOPIC PROCEDURE TODAY AT THE [facility]:   Refer to the procedure report that was given to you for any specific questions about what was found during the examination.  If the procedure report does not answer your questions, please call your gastroenterologist to clarify.  If you requested that your care partner not be given the details of your procedure findings, then the procedure report has been included in a sealed envelope for you to review at your convenience later. ° °YOU SHOULD EXPECT: Some feelings of bloating in the abdomen. Passage of more gas than usual.  Walking can help get rid of the air that was put into your GI tract during the procedure and reduce the bloating. If you had a lower endoscopy (such as a colonoscopy or flexible sigmoidoscopy) you may notice spotting of blood in your stool or on the toilet paper. If you underwent a bowel prep for your procedure, you may not have a normal bowel movement for a few days. ° °Please Note:  You might notice some irritation and congestion in your nose or some drainage.  This is from the oxygen used during your procedure.  There is no need for concern and it should clear up in a day or so. ° °SYMPTOMS TO REPORT IMMEDIATELY: ° °Following lower endoscopy (colonoscopy or flexible sigmoidoscopy): ° Excessive amounts of blood in the stool ° Significant tenderness or worsening of abdominal pains ° Swelling of the abdomen that is new, acute ° Fever of 100°F or higher ° ° °For urgent or emergent issues, a gastroenterologist can be reached at any hour by calling [phone]. °Do not use MyChart messaging for urgent concerns.  ° ° °DIET:  We do recommend a small meal at first, but then you may proceed to your regular diet.  Drink plenty of fluids but you should avoid alcoholic beverages for 24 hours. ° °ACTIVITY:  You should plan to take it easy for the rest of  today and you should NOT DRIVE or use heavy machinery until tomorrow (because of the sedation medicines used during the test).   ° °FOLLOW UP: °Our staff will call the number listed on your records 48-72 hours following your procedure to check on you and address any questions or concerns that you may have regarding the information given to you following your procedure. If we do not reach you, we will leave a message.  We will attempt to reach you two times.  During this call, we will ask if you have developed any symptoms of COVID 19. If you develop any symptoms (ie: fever, flu-like symptoms, shortness of breath, cough etc.) before then, please call [phone].  If you test positive for Covid 19 in the 2 weeks post procedure, please call and report this information to us.   ° °If any biopsies were taken you will be contacted by phone or by letter within the next 1-3 weeks.  Please call us at [phone] if you have not heard about the biopsies in 3 weeks.  ° ° °SIGNATURES/CONFIDENTIALITY: °You and/or your care partner have signed paperwork which will be entered into your electronic medical record.  These signatures attest to the fact that that the information above on your After Visit Summary has been reviewed and is understood.  Full responsibility of the confidentiality of this discharge information lies with you and/or your care-partner.  °

## 2019-08-25 NOTE — Op Note (Signed)
[facility] Patient Name: Courtney Valdez Procedure Date: 08/25/2019 9:50 AM MRN: [id number] Endoscopist: Sherilyn Cooter L. Myrtie Neither , MD Age: 54 Referring MD:  Date of Birth: 04-Jul-1965 Gender: Female Account #: [id number] Procedure:                Colonoscopy Indications:              Screening for colorectal malignant neoplasm, This                            is the patient's first colonoscopy Medicines:                Monitored Anesthesia Care Procedure:                Pre-Anesthesia Assessment:                           - Prior to the procedure, a History and Physical                            was performed, and patient medications and                            allergies were reviewed. The patient's tolerance of                            previous anesthesia was also reviewed. The risks                            and benefits of the procedure and the sedation                            options and risks were discussed with the patient.                            All questions were answered, and informed consent                            was obtained. Prior Anticoagulants: The patient has                            taken no previous anticoagulant or antiplatelet                            agents. ASA Grade Assessment: II - A patient with                            mild systemic disease. After reviewing the risks                            and benefits, the patient was deemed in                            satisfactory condition to undergo the procedure.  After obtaining informed consent, the colonoscope                            was passed under direct vision. Throughout the                            procedure, the patient's blood pressure, pulse, and                            oxygen saturations were monitored continuously. The                            Colonoscope was introduced through the anus and                            advanced to the the cecum,  identified by                            appendiceal orifice and ileocecal valve. The                            colonoscopy was performed without difficulty. The                            patient tolerated the procedure well. The quality                            of the bowel preparation was good. The ileocecal                            valve, appendiceal orifice, and rectum were                            photographed. The bowel preparation used was SUPREP. Scope In: 9:59:42 AM Scope Out: 10:13:56 AM Scope Withdrawal Time: 0 hours 10 minutes 48 seconds  Total Procedure Duration: 0 hours 14 minutes 14 seconds  Findings:                 The perianal and digital rectal examinations were                            normal.                           The entire examined colon appeared normal on direct                            and retroflexion views. Complications:            No immediate complications. Estimated Blood Loss:     Estimated blood loss: none. Impression:               - The entire examined colon is normal on direct and  retroflexion views.                           - No specimens collected. Recommendation:           - Patient has a contact number available for                            emergencies. The signs and symptoms of potential                            delayed complications were discussed with the                            patient. Return to normal activities tomorrow.                            Written discharge instructions were provided to the                            patient.                           - Resume previous diet.                           - Continue present medications.                           - Repeat colonoscopy in 10 years for screening                            purposes. Tiny Rietz L. Myrtie Neither, MD 08/25/2019 10:18:18 AM This report has been signed electronically.

## 2019-08-25 NOTE — Progress Notes (Signed)
Pt's states no medical or surgical changes since previsit or office visit.  Vitals- Courtney 

## 2019-08-27 ENCOUNTER — Telehealth: Payer: Self-pay | Admitting: *Deleted

## 2019-08-27 NOTE — Telephone Encounter (Signed)
1. Have you developed a fever since your procedure? no  2.   Have you had an respiratory symptoms (SOB or cough) since your procedure? no  3.   Have you tested positive for COVID 19 since your procedure no  4.   Have you had any family members/close contacts diagnosed with the COVID 19 since your procedure?  no   If yes to any of these questions please route to Laverna Peace, RN and Karlton Lemon, RN Follow up Call-  Call back number 08/25/2019  Post procedure Call Back phone  # [phone]  Some recent data might be hidden     Patient questions:  Do you have a fever, pain , or abdominal swelling? No. Pain Score  0 *  Have you tolerated food without any problems? Yes.    Have you been able to return to your normal activities? Yes.    Do you have any questions about your discharge instructions: Diet   No. Medications  No. Follow up visit  No.  Do you have questions or concerns about your Care? No.  Actions: * If pain score is 4 or above: No action needed, pain <4.

## 2019-09-04 ENCOUNTER — Other Ambulatory Visit: Payer: 59

## 2019-09-11 ENCOUNTER — Encounter: Payer: Self-pay | Admitting: Obstetrics

## 2019-09-11 ENCOUNTER — Other Ambulatory Visit: Payer: Self-pay

## 2019-09-11 ENCOUNTER — Ambulatory Visit: Payer: 59 | Admitting: Obstetrics & Gynecology

## 2019-09-11 ENCOUNTER — Ambulatory Visit (INDEPENDENT_AMBULATORY_CARE_PROVIDER_SITE_OTHER): Payer: 59 | Admitting: Obstetrics

## 2019-09-11 ENCOUNTER — Other Ambulatory Visit (HOSPITAL_COMMUNITY)
Admission: RE | Admit: 2019-09-11 | Discharge: 2019-09-11 | Disposition: A | Discharge status: Home / Self Care | Payer: 59 | Source: Ambulatory Visit | Attending: Obstetrics | Admitting: Obstetrics

## 2019-09-11 VITALS — BP 172/97 | HR 94 | Ht 65.0 in | Wt 118.0 lb

## 2019-09-11 DIAGNOSIS — E2839 Other primary ovarian failure: Secondary | ICD-10-CM

## 2019-09-11 DIAGNOSIS — I10 Essential (primary) hypertension: Secondary | ICD-10-CM

## 2019-09-11 DIAGNOSIS — Z113 Encounter for screening for infections with a predominantly sexual mode of transmission: Secondary | ICD-10-CM

## 2019-09-11 DIAGNOSIS — Z01419 Encounter for gynecological examination (general) (routine) without abnormal findings: Secondary | ICD-10-CM | POA: Diagnosis not present

## 2019-09-11 DIAGNOSIS — N898 Other specified noninflammatory disorders of vagina: Secondary | ICD-10-CM

## 2019-09-11 DIAGNOSIS — Q510 Agenesis and aplasia of uterus: Secondary | ICD-10-CM

## 2019-09-11 DIAGNOSIS — Z780 Asymptomatic menopausal state: Secondary | ICD-10-CM

## 2019-09-11 NOTE — Progress Notes (Signed)
Patient presents for Annual Exam  Last pap: 04/2018 WNL Pt notes Hx of abnormal paps  Mammogram: 08/22/19 diag scheduled for 09/25/19 Family Hx of Breast Cancer: Mother and M Aunt  Family Hx of Cervical Cancer : None  STD Screening: Desires    CC:  None

## 2019-09-11 NOTE — Progress Notes (Signed)
Subjective:         Courtney Valdez is a 54 y.o. female here for a routine exam.  Current complaints:  None  Personal health questionnaire:  Is patient Ashkenazi Jewish, have a family history of breast and/or ovarian cancer: yes Is there a family history of uterine cancer diagnosed at age < 49, gastrointestinal cancer, urinary tract cancer, family member who is a Personnel officer syndrome-associated carrier: no Is the patient overweight and hypertensive, family history of diabetes, personal history of gestational diabetes, preeclampsia or PCOS: no Is patient over 58, have PCOS,  family history of premature CHD under age 36, diabetes, smoke, have hypertension or peripheral artery disease:  no At any time, has a partner hit, kicked or otherwise hurt or frightened you?: no Over the past 2 weeks, have you felt down, depressed or hopeless?: no Over the past 2 weeks, have you felt little interest or pleasure in doing things?:no   Gynecologic History No LMP recorded. (Menstrual status: Other). Contraception: post menopausal status Last Pap: 4 / 2020. Results were: normal Last mammogram: 08-22-2019. Results were: normal  Obstetric History OB History  Gravida Para Term Preterm AB Living  0 0 0 0 0 0  SAB TAB Ectopic Multiple Live Births  0 0 0 0 0    Past Medical History:  Diagnosis Date  . Hypertension     Past Surgical History:  Procedure Laterality Date  . NO PAST SURGERIES     as of 08/19/19     Current Outpatient Medications:  .  amLODipine (NORVASC) 5 MG tablet, Take 5 mg by mouth daily., Disp: , Rfl:  .  ferrous sulfate 325 (65 FE) MG tablet, Take 325 mg by mouth daily., Disp: , Rfl:  .  Cyanocobalamin (B-12 PO), Take 1 tablet by mouth daily. (Patient not taking: Reported on 09/11/2019), Disp: , Rfl:  .  ibuprofen (ADVIL,MOTRIN) 200 MG tablet, Take 400 mg by mouth as needed. For pain (Patient not taking: Reported on 08/19/2019), Disp: , Rfl:  .  methocarbamol (ROBAXIN) 500 MG tablet,  Take 1 tablet (500 mg total) by mouth 2 (two) times daily. (Patient not taking: Reported on 08/19/2019), Disp: 20 tablet, Rfl: 0 No Known Allergies  Social History   Tobacco Use  . Smoking status: Former Smoker    Types: Cigarettes  . Smokeless tobacco: Never Used  Substance Use Topics  . Alcohol use: Yes    Comment: socially     Family History  Problem Relation Age of Onset  . Colon cancer Father 69  . Breast cancer Mother   . Breast cancer Maternal Aunt   . Colon polyps Neg Hx   . Esophageal cancer Neg Hx   . Rectal cancer Neg Hx   . Stomach cancer Neg Hx       Review of Systems  Constitutional: negative for fatigue and weight loss Respiratory: negative for cough and wheezing Cardiovascular: negative for chest pain, fatigue and palpitations Gastrointestinal: negative for abdominal pain and change in bowel habits Musculoskeletal:negative for myalgias Neurological: negative for gait problems and tremors Behavioral/Psych: negative for abusive relationship, depression Endocrine: negative for temperature intolerance    Genitourinary:negative for abnormal menstrual periods, genital lesions, hot flashes, sexual problems and vaginal discharge Integument/breast: negative for breast lump, breast tenderness, nipple discharge and skin lesion(s)    Objective:       BP (!) 172/97   Pulse 94   Ht 5\' 5"  (1.651 m)   Wt 118 lb (53.5 kg)  BMI 19.64 kg/m  General:   alert and no distress  Skin:   no rash or abnormalities  Lungs:   clear to auscultation bilaterally  Heart:   regular rate and rhythm, S1, S2 normal, no murmur, click, rub or gallop  Breasts:   normal without suspicious masses, skin or nipple changes or axillary nodes  Abdomen:  normal findings: no organomegaly, soft, non-tender and no hernia  Pelvis:  External genitalia: normal general appearance Urinary system: urethral meatus normal and bladder without fullness, nontender Vaginal: normal without tenderness,  induration or masses Cervix: absent Adnexa: normal bimanual exam Uterus: absent   Lab Review Urine pregnancy test Labs reviewed yes Radiologic studies reviewed yes  50% of 20 min visit spent on counseling and coordination of care.   Assessment:     1. Encounter for annual routine gynecological examination Rx: - Cytology - PAP( [facility])  2. Congenital absence of uterus  3. Vaginal discharge Rx: - Cervicovaginal ancillary only( )  4. Screen for STD (sexually transmitted disease) Rx: - Hepatitis B surface antigen - Hepatitis C antibody - HIV Antibody (routine testing w rflx) - RPR  5. HTN (hypertension), benign - managed by PCP  6. Postmenopause - clinically stable  7. Hypoestrogenism Rx: - DG BONE DENSITY (DXA); Future    Plan:    Education reviewed: calcium supplements, depression evaluation, low fat, low cholesterol diet, safe sex/STD prevention, self breast exams and weight bearing exercise. Mammogram ordered. Follow up in: 1 year.    Orders Placed This Encounter  Procedures  . DG BONE DENSITY (DXA)    Standing Status:   Future    Standing Expiration Date:   09/10/2020    Order Specific Question:   Reason for Exam (SYMPTOM  OR DIAGNOSIS REQUIRED)    Answer:   Hypoestrogenism    Order Specific Question:   Is the patient pregnant?    Answer:   No    Order Specific Question:   Preferred imaging location?    Answer:   [facility]  . Hepatitis B surface antigen  . Hepatitis C antibody  . HIV Antibody (routine testing w rflx)  . RPR    Brock Bad, MD 09/11/2019 1:28 PM

## 2019-09-12 ENCOUNTER — Other Ambulatory Visit: Payer: Self-pay | Admitting: Obstetrics

## 2019-09-12 DIAGNOSIS — B9689 Other specified bacterial agents as the cause of diseases classified elsewhere: Secondary | ICD-10-CM

## 2019-09-12 LAB — CERVICOVAGINAL ANCILLARY ONLY
Bacterial Vaginitis (gardnerella): POSITIVE — AB
Candida Glabrata: NEGATIVE
Candida Vaginitis: NEGATIVE
Chlamydia: NEGATIVE
Comment: NEGATIVE
Comment: NEGATIVE
Comment: NEGATIVE
Comment: NEGATIVE
Comment: NEGATIVE
Comment: NORMAL
Neisseria Gonorrhea: NEGATIVE
Trichomonas: NEGATIVE

## 2019-09-12 LAB — RPR: RPR Ser Ql: NONREACTIVE

## 2019-09-12 LAB — HEPATITIS B SURFACE ANTIGEN: Hepatitis B Surface Ag: NEGATIVE

## 2019-09-12 LAB — HEPATITIS C ANTIBODY: Hep C Virus Ab: >11 s/co ratio — ABNORMAL HIGH (ref 0.0–0.9)

## 2019-09-12 LAB — HIV ANTIBODY (ROUTINE TESTING W REFLEX): HIV Screen 4th Generation wRfx: NONREACTIVE

## 2019-09-12 MED ORDER — TINIDAZOLE 500 MG PO TABS: 1000.0000 mg | ORAL_TABLET | Freq: Every day | ORAL | 2 refills | Status: DC

## 2019-09-16 ENCOUNTER — Other Ambulatory Visit: Payer: Self-pay | Admitting: Obstetrics

## 2019-09-16 DIAGNOSIS — R768 Other specified abnormal immunological findings in serum: Secondary | ICD-10-CM

## 2019-09-16 NOTE — Progress Notes (Signed)
Positive Hep C antibody screen.  I called patient and explained the positive antibody screen, and that a reflex confirmatory test needs to be done and will require a second blood draw.  She will come to the office on Thursday for the confirmatory test to be done - code # [id number]

## 2019-09-18 ENCOUNTER — Other Ambulatory Visit: Payer: Self-pay

## 2019-09-18 ENCOUNTER — Other Ambulatory Visit: Payer: 59

## 2019-09-18 DIAGNOSIS — B9689 Other specified bacterial agents as the cause of diseases classified elsewhere: Secondary | ICD-10-CM

## 2019-09-18 DIAGNOSIS — R768 Other specified abnormal immunological findings in serum: Secondary | ICD-10-CM

## 2019-09-18 DIAGNOSIS — E2839 Other primary ovarian failure: Secondary | ICD-10-CM

## 2019-09-18 DIAGNOSIS — N760 Acute vaginitis: Secondary | ICD-10-CM

## 2019-09-18 MED ORDER — TINIDAZOLE 500 MG PO TABS: 1000.0000 mg | ORAL_TABLET | Freq: Every day | ORAL | 2 refills | Status: DC

## 2019-09-18 NOTE — Progress Notes (Signed)
Pt states she lost recent Rx  Rx resent for BV.

## 2019-09-21 ENCOUNTER — Other Ambulatory Visit: Payer: Self-pay | Admitting: Obstetrics

## 2019-09-21 LAB — HEPATITIS C VRS RNA DETECT BY PCR-QUAL: HCV RNA NAA Qualitative: POSITIVE — AB

## 2019-09-22 ENCOUNTER — Telehealth: Payer: Self-pay

## 2019-09-22 ENCOUNTER — Other Ambulatory Visit: Payer: Self-pay

## 2019-09-22 ENCOUNTER — Other Ambulatory Visit: Payer: Self-pay | Admitting: Obstetrics

## 2019-09-22 DIAGNOSIS — R768 Other specified abnormal immunological findings in serum: Secondary | ICD-10-CM

## 2019-09-22 NOTE — Telephone Encounter (Signed)
Referral has been place for patient to see ID for Posiitve Hep.C

## 2019-09-22 NOTE — Telephone Encounter (Signed)
Call patient to let her know a referral had been placed for her to see ID for hep c diagnosis for further work-up.No-answer-left message for her to call us back to discuss further in details if she has question.

## 2019-09-25 ENCOUNTER — Other Ambulatory Visit: Payer: Self-pay

## 2019-09-25 ENCOUNTER — Ambulatory Visit
Admission: RE | Admit: 2019-09-25 | Discharge: 2019-09-25 | Disposition: A | Discharge status: Home / Self Care | Payer: 59 | Source: Ambulatory Visit | Attending: Internal Medicine | Admitting: Internal Medicine

## 2019-09-25 DIAGNOSIS — R928 Other abnormal and inconclusive findings on diagnostic imaging of breast: Secondary | ICD-10-CM

## 2019-10-16 ENCOUNTER — Telehealth: Payer: Self-pay

## 2019-10-16 ENCOUNTER — Encounter: Payer: 59 | Admitting: Infectious Diseases

## 2019-10-16 NOTE — Telephone Encounter (Signed)
[facility] Patient Product/process development scientist completed.    The patient is insured through Medicare Part D.  Insurance will pay for Mavyret with a PA.  We will need to meet to get income verification.  We will continue to follow to see if copay assistance is needed.  Clearance Coots, CPhT Specialty Pharmacy Patient [facility] Phone: [phone] Fax:  [phone]

## 2019-10-16 NOTE — Telephone Encounter (Signed)
Attempted to call patient to reschedule missed new patient appointment. Unable to reach patient at this time. Left voicemail requesting call back. Lorenso Courier, [place]

## 2019-10-30 ENCOUNTER — Other Ambulatory Visit: Payer: Self-pay

## 2019-10-30 ENCOUNTER — Ambulatory Visit (INDEPENDENT_AMBULATORY_CARE_PROVIDER_SITE_OTHER): Payer: 59 | Admitting: Infectious Diseases

## 2019-10-30 ENCOUNTER — Encounter: Payer: Self-pay | Admitting: Infectious Diseases

## 2019-10-30 VITALS — BP 121/77 | HR 77 | Temp 97.6°F | Wt 123.0 lb

## 2019-10-30 DIAGNOSIS — Z23 Encounter for immunization: Secondary | ICD-10-CM

## 2019-10-30 DIAGNOSIS — B182 Chronic viral hepatitis C: Secondary | ICD-10-CM | POA: Diagnosis not present

## 2019-10-30 NOTE — Progress Notes (Signed)
[facility]                                      [address], [place], [place], [address]                                               Phn. [phone]; Fax: [phone]                                                               Date:  Reason for Visit: Hepatitis C    HPI: Courtney Valdez is a 54 y.o.old female with PMH of HTN who is here for evaluation and management after recent diagnosis with Hepatitis C. She .was tested positive for HCV ab and HCV RNA in early September 2021 as part of routine screening by her PCP and did not have any knowledge about Hep C prior to that. 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 She does not do any needle sticks as part of her work. Denies ever using IVD in the past. Denies previous history of blood transfusions prior to 1992, sharing of toothbrushes/razors, or sexual contact with known positive partners.  No personal or family history of liver disease.  has not received treatment to date. She has been tested for HIV and is negative. She smokes 1-2 cigarettes in a week, drinks 2 wines in the week and vodka in the weekend. Denies using any drugs. Has not been sexually active for a while. She lives with her sister.   She has been taking Augmentin which was prescribed by her PCP for a traumatic injury at her left great toe after a chair fell on her foot. She feels a lot of better after being on the abx. The pain and tenderness has significantly got better and she is now able to walk more comfortably  ROS: Denies yellowish discoloration of sclera and skin, abdominal pain/distension, hematemesis.            Denies cough, fever, chills, nightsweats, nausea, vomiting, diarrhea, constipation, weight loss, recent hospitalizations, rashes, joint complaints, shortness of breath, chest pain, headaches, dysuria .  Current Outpatient Medications on File Prior to Visit  Medication Sig Dispense  Refill  . amLODipine (NORVASC) 5 MG tablet Take 5 mg by mouth daily.    . Cyanocobalamin (B-12 PO) Take 1 tablet by mouth daily.     . ferrous sulfate 325 (65 FE) MG tablet Take 325 mg by mouth daily.    Marland Kitchen ibuprofen (ADVIL,MOTRIN) 200 MG tablet Take 400 mg by mouth as needed. For pain     . mirtazapine (REMERON) 7.5 MG tablet SMARTSIG:1 Tablet(s) By Mouth Every Evening    . tinidazole (TINDAMAX) 500 MG tablet Take 2 tablets (1,000 mg total) by mouth daily with breakfast. 10 tablet 2  . amoxicillin-clavulanate (AUGMENTIN) 875-125 MG tablet Take 1 tablet by mouth 2 (two) times daily.    . methocarbamol (ROBAXIN) 500 MG tablet Take 1 tablet (500 mg total) by mouth 2 (two) times daily. (Patient not taking:  Reported on 10/30/2019) 20 tablet 0   No current facility-administered medications on file prior to visit.    No Known Allergies  Past Medical History:  Diagnosis Date  . Hypertension    Past Surgical History:  Procedure Laterality Date  . NO PAST SURGERIES     as of 08/19/19   Social History   Socioeconomic History  . Marital status: Single    Spouse name: Not on file  . Number of children: Not on file  . Years of education: Not on file  . Highest education level: Not on file  Occupational History  . Not on file  Tobacco Use  . Smoking status: Light Tobacco Smoker    Types: Cigarettes  . Smokeless tobacco: Never Used  Vaping Use  . Vaping Use: Never used  Substance and Sexual Activity  . Alcohol use: Yes    Comment: socially   . Drug use: Not Currently  . Sexual activity: Yes    Partners: Male    Birth control/protection: None  Other Topics Concern  . Not on file  Social History Narrative  . Not on file   Social Determinants of Health   Financial Resource Strain:   . Difficulty of Paying Living Expenses: Not on file  Food Insecurity:   . Worried About Programme researcher, broadcasting/film/video in the Last Year: Not on file  . Ran Out of Food in the Last Year: Not on file   Transportation Needs:   . Lack of Transportation (Medical): Not on file  . Lack of Transportation (Non-Medical): Not on file  Physical Activity:   . Days of Exercise per Week: Not on file  . Minutes of Exercise per Session: Not on file  Stress:   . Feeling of Stress : Not on file  Social Connections:   . Frequency of Communication with Friends and Family: Not on file  . Frequency of Social Gatherings with Friends and Family: Not on file  . Attends Religious Services: Not on file  . Active Member of Clubs or Organizations: Not on file  . Attends Banker Meetings: Not on file  . Marital Status: Not on file  Intimate Partner Violence:   . Fear of Current or Ex-Partner: Not on file  . Emotionally Abused: Not on file  . Physically Abused: Not on file  . Sexually Abused: Not on file   Vitals  BP 121/77   Pulse 77   Temp 97.6 F (36.4 C) (Oral)   Wt 123 lb (55.8 kg)   BMI 20.47 kg/m   Physical exam: Gen: Alert and oriented x 3, no acute distress HEENT: [place]/AT, PERL, EOMI, no scleral icterus, no pale conjunctivae, hearing normal, oral mucosa moist Neck: Supple, no lymphadenopathy Cardio: Regular rate and rhythm; +S1 and S2; no murmurs, gallops, or rubs Resp: CTAB; no wheezes, rhonchi, or rales GI: Soft, nontender, nondistended, bowel sounds present Extremities: No cyanosis, clubbing, or edema; +2 PT and DP pulses LEFT GREAT TOE- NO FLUCTUANCE/PURULENCE/ERYTHEMA Skin: No rashes, lesions, or ecchymoses Neuro: No focal deficits Psych: Calm, cooperative  Laboratory  09/11/19 - HCV antibody positive 09/18/19 HCV RNA positive  Assessment/Plan: Hepatitis C Discussed the pathogenesis, transmission, prevention, risks of left untreated, and treatment options for hepatitis C Orders Placed This Encounter  Procedures  . US ABDOMEN COMPLETE W/ELASTOGRAPHY  . Hepatitis C RNA quantitative  . Liver Fibrosis, FibroTest-ActiTest  . Hepatitis C genotype  . Hepatitis B surface  antibody,qualitative  . Hepatitis B core antibody, total  .  Hepatitis A antibody, total  . CBC  . COMPLETE METABOLIC PANEL WITH GFR  . Protime-INR   Follow up with Cassie after labs are back for treatment initiation  2. Alcohol Use - Counseled on cutting down, possibly quitting  3. Immunization - Discussed about Hep A and Hep B vaccine if non immune  - She will get COVID booster on Friday   I spent greater than 40  minutes with the patient including review of prior medical records with greater than 50% of time in face to face counsel of the patient.   Patient's labs were reviewed as well as his previous records. Patients questions were addressed and answered.   Odette Fraction, MD Infectious Diseases  Office phone [phone] Fax no. [phone]

## 2019-10-31 ENCOUNTER — Ambulatory Visit (INDEPENDENT_AMBULATORY_CARE_PROVIDER_SITE_OTHER): Payer: 59

## 2019-10-31 DIAGNOSIS — Z23 Encounter for immunization: Secondary | ICD-10-CM

## 2019-10-31 DIAGNOSIS — Z0000 Encounter for general adult medical examination without abnormal findings: Secondary | ICD-10-CM | POA: Insufficient documentation

## 2019-10-31 DIAGNOSIS — B182 Chronic viral hepatitis C: Secondary | ICD-10-CM | POA: Insufficient documentation

## 2019-10-31 HISTORY — DX: Chronic viral hepatitis C: B18.2

## 2019-10-31 NOTE — Progress Notes (Signed)
   Covid-19 Vaccination Clinic  Name:  Caitlynne Harbeck    MRN: [id number] DOB: 27-May-1965  10/31/2019  Ms. Shek was observed post Covid-19 immunization for 15 minutes without incident. She was provided with Vaccine Information Sheet and instruction to access the V-Safe system.   Ms. Lafontant was instructed to call 911 with any severe reactions post vaccine: Marland Kitchen Difficulty breathing  . Swelling of face and throat  . A fast heartbeat  . A bad rash all over body  . Dizziness and weakness   Immunizations Administered    Name Date Dose VIS Date Route   Pfizer COVID-19 Vaccine 10/31/2019  8:59 AM 0.3 mL 03/05/2018 Intramuscular   Manufacturer: [employer], [employer]   Lot: [id number]   NDC: [address]-9    Jahniah Pallas T Pricilla Loveless

## 2019-11-06 LAB — CBC
HCT: 39.7 % (ref 35.0–45.0)
Hemoglobin: 13.7 g/dL (ref 11.7–15.5)
MCH: 30.7 pg (ref 27.0–33.0)
MCHC: 34.5 g/dL (ref 32.0–36.0)
MCV: 89 fL (ref 80.0–100.0)
MPV: 10.8 fL (ref 7.5–12.5)
Platelets: 95 10*3/uL — ABNORMAL LOW (ref 140–400)
RBC: 4.46 10*6/uL (ref 3.80–5.10)
RDW: 12 % (ref 11.0–15.0)
WBC: 6.3 10*3/uL (ref 3.8–10.8)

## 2019-11-06 LAB — LIVER FIBROSIS, FIBROTEST-ACTITEST
ALT: 33 U/L — ABNORMAL HIGH (ref 6–29)
Alpha-2-Macroglobulin: 324 mg/dL — ABNORMAL HIGH (ref 106–279)
Apolipoprotein A1: 196 mg/dL (ref 101–198)
Bilirubin: 0.4 mg/dL (ref 0.2–1.2)
Fibrosis Score: 0.58
GGT: 395 U/L — ABNORMAL HIGH (ref 3–70)
Haptoglobin: 64 mg/dL (ref 43–212)
Necroinflammat ACT Score: 0.24
Reference ID: 3604129

## 2019-11-06 LAB — HEPATITIS C GENOTYPE

## 2019-11-06 LAB — COMPLETE METABOLIC PANEL WITH GFR
AG Ratio: 0.9 (calc) — ABNORMAL LOW (ref 1.0–2.5)
ALT: 35 U/L — ABNORMAL HIGH (ref 6–29)
AST: 90 U/L — ABNORMAL HIGH (ref 10–35)
Albumin: 3.3 g/dL — ABNORMAL LOW (ref 3.6–5.1)
Alkaline phosphatase (APISO): 115 U/L (ref 37–153)
BUN: 8 mg/dL (ref 7–25)
CO2: 30 mmol/L (ref 20–32)
Calcium: 8.9 mg/dL (ref 8.6–10.4)
Chloride: 106 mmol/L (ref 98–110)
Creat: 0.64 mg/dL (ref 0.50–1.05)
GFR, Est African American: 116 mL/min/{1.73_m2} (ref >=60)
GFR, Est Non African American: 100 mL/min/{1.73_m2} (ref >=60)
Globulin: 3.8 g/dL (calc) — ABNORMAL HIGH (ref 1.9–3.7)
Glucose, Bld: 102 mg/dL — ABNORMAL HIGH (ref 65–99)
Potassium: 3.6 mmol/L (ref 3.5–5.3)
Sodium: 141 mmol/L (ref 135–146)
Total Bilirubin: 0.5 mg/dL (ref 0.2–1.2)
Total Protein: 7.1 g/dL (ref 6.1–8.1)

## 2019-11-06 LAB — PROTIME-INR
INR: 1.2 — ABNORMAL HIGH
Prothrombin Time: 12.4 s — ABNORMAL HIGH (ref 9.0–11.5)

## 2019-11-06 LAB — HEPATITIS C RNA QUANTITATIVE
HCV RNA, PCR, QN (Log): 6.26 log IU/mL — ABNORMAL HIGH
HCV RNA, PCR, QN: 1820000 IU/mL — ABNORMAL HIGH

## 2019-11-06 LAB — HEPATITIS B CORE ANTIBODY, TOTAL: Hep B Core Total Ab: NONREACTIVE

## 2019-11-06 LAB — HEPATITIS A ANTIBODY, TOTAL: Hepatitis A AB,Total: REACTIVE — AB

## 2019-11-06 LAB — HEPATITIS B SURFACE ANTIBODY,QUALITATIVE: Hep B S Ab: NONREACTIVE

## 2019-11-07 NOTE — Progress Notes (Signed)
I believe I was waiting on Dr. Elinor Parkinson to suggest a medication.   Can we proceed with Mavyret?  We will make a follow up appointment once she starts treatment.

## 2019-11-11 NOTE — Progress Notes (Signed)
Seems like she is scheduled to have a [place] elastography on 11/5. I am thinking about Mavyret * 12 weeks for her ( compensated cirrhosis, CRP score is less than 7).

## 2019-11-14 ENCOUNTER — Ambulatory Visit (HOSPITAL_COMMUNITY): Payer: 59

## 2019-11-21 ENCOUNTER — Other Ambulatory Visit: Payer: Self-pay

## 2019-11-21 ENCOUNTER — Ambulatory Visit (INDEPENDENT_AMBULATORY_CARE_PROVIDER_SITE_OTHER): Payer: 59

## 2019-11-21 DIAGNOSIS — Z23 Encounter for immunization: Secondary | ICD-10-CM

## 2019-11-27 ENCOUNTER — Ambulatory Visit (HOSPITAL_COMMUNITY): Admission: RE | Admit: 2019-11-27 | Payer: 59 | Source: Ambulatory Visit

## 2019-12-11 ENCOUNTER — Other Ambulatory Visit: Payer: Self-pay

## 2019-12-11 ENCOUNTER — Ambulatory Visit (HOSPITAL_COMMUNITY)
Admission: RE | Admit: 2019-12-11 | Discharge: 2019-12-11 | Disposition: A | Discharge status: Home / Self Care | Payer: 59 | Source: Ambulatory Visit | Attending: Infectious Diseases | Admitting: Infectious Diseases

## 2019-12-11 DIAGNOSIS — B182 Chronic viral hepatitis C: Secondary | ICD-10-CM | POA: Diagnosis not present

## 2019-12-18 NOTE — Progress Notes (Signed)
Are you good with doing Mavyret for 8 weeks? I can't see her [place] right now for some reason.

## 2019-12-18 NOTE — Progress Notes (Signed)
Yes we will take care of it

## 2019-12-29 ENCOUNTER — Other Ambulatory Visit: Payer: Self-pay | Admitting: Pharmacist

## 2019-12-29 DIAGNOSIS — B182 Chronic viral hepatitis C: Secondary | ICD-10-CM

## 2019-12-29 MED ORDER — SOFOSBUVIR-VELPATASVIR 400-100 MG PO TABS: 1.0000 | ORAL_TABLET | Freq: Every day | ORAL | 2 refills | Status: DC

## 2019-12-29 NOTE — Progress Notes (Signed)
Epclusa x 12 weeks for chronic Hepatitis C. We will start PA process and let patient know when they are approved.

## 2019-12-30 ENCOUNTER — Telehealth: Payer: Self-pay

## 2019-12-30 NOTE — Telephone Encounter (Signed)
[facility] Patient Advocate Encounter   Received notification from [facility]) that prior authorization for Dorita Fray is required.   PA submitted on 12/30/19 Key [id number] Status is pending    [facility] will continue to follow.   Clearance Coots, CPhT Specialty Pharmacy Patient [facility] for [facility] Phone: [phone] Fax:  [phone]

## 2020-01-07 ENCOUNTER — Emergency Department (HOSPITAL_COMMUNITY): Payer: 59

## 2020-01-07 ENCOUNTER — Emergency Department (HOSPITAL_COMMUNITY)
Admission: EM | Admit: 2020-01-07 | Discharge: 2020-01-07 | Disposition: A | Discharge status: Home / Self Care | Payer: 59 | Attending: Emergency Medicine | Admitting: Emergency Medicine

## 2020-01-07 ENCOUNTER — Telehealth: Payer: Self-pay

## 2020-01-07 ENCOUNTER — Other Ambulatory Visit: Payer: Self-pay | Admitting: Pharmacist

## 2020-01-07 ENCOUNTER — Encounter (HOSPITAL_COMMUNITY): Payer: Self-pay

## 2020-01-07 ENCOUNTER — Other Ambulatory Visit: Payer: Self-pay

## 2020-01-07 DIAGNOSIS — Z79899 Other long term (current) drug therapy: Secondary | ICD-10-CM | POA: Insufficient documentation

## 2020-01-07 DIAGNOSIS — F17210 Nicotine dependence, cigarettes, uncomplicated: Secondary | ICD-10-CM | POA: Insufficient documentation

## 2020-01-07 DIAGNOSIS — M25552 Pain in left hip: Secondary | ICD-10-CM | POA: Diagnosis not present

## 2020-01-07 DIAGNOSIS — B182 Chronic viral hepatitis C: Secondary | ICD-10-CM

## 2020-01-07 DIAGNOSIS — S46912A Strain of unspecified muscle, fascia and tendon at shoulder and upper arm level, left arm, initial encounter: Secondary | ICD-10-CM | POA: Insufficient documentation

## 2020-01-07 DIAGNOSIS — R0789 Other chest pain: Secondary | ICD-10-CM | POA: Insufficient documentation

## 2020-01-07 DIAGNOSIS — S4992XA Unspecified injury of left shoulder and upper arm, initial encounter: Secondary | ICD-10-CM | POA: Diagnosis present

## 2020-01-07 DIAGNOSIS — T148XXA Other injury of unspecified body region, initial encounter: Secondary | ICD-10-CM

## 2020-01-07 DIAGNOSIS — I10 Essential (primary) hypertension: Secondary | ICD-10-CM | POA: Diagnosis not present

## 2020-01-07 MED ORDER — SOFOSBUVIR-VELPATASVIR 400-100 MG PO TABS: 1.0000 | ORAL_TABLET | Freq: Every day | ORAL | 2 refills | Status: DC

## 2020-01-07 MED ORDER — CYCLOBENZAPRINE HCL 10 MG PO TABS: 10.0000 mg | ORAL_TABLET | Freq: Three times a day (TID) | ORAL | 0 refills | Status: DC | PRN

## 2020-01-07 MED ORDER — CYCLOBENZAPRINE HCL 10 MG PO TABS
5.0000 mg | ORAL_TABLET | Freq: Once | ORAL | Status: AC
  Administered 2020-01-07: 5 mg via ORAL
  Filled 2020-01-07: qty 1

## 2020-01-07 MED ORDER — OXYCODONE-ACETAMINOPHEN 5-325 MG PO TABS
1.0000 | ORAL_TABLET | Freq: Once | ORAL | Status: AC
  Administered 2020-01-07: 1 via ORAL
  Filled 2020-01-07: qty 1

## 2020-01-07 NOTE — Progress Notes (Signed)
Patient's insurance requires Epclusa to be filled at [facility]. Resending Rx now. Lupita Leash will call patient and coordinate.
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
## 2020-01-07 NOTE — Discharge Instructions (Signed)
Take Tylenol Motrin as needed for pain control. Can take the prescribed muscle relaxer as well.

## 2020-01-07 NOTE — Telephone Encounter (Signed)
[facility] Patient Advocate Encounter  Prior Authorization for [employer] [place]) has been approved.    PA# [id number] Effective dates: 01/07/20 through 03/31/20  Prescription have to be filled with [employer].  I will follow up on [employer] .  [facility] will continue to follow.  Clearance Coots, CPhT Specialty Pharmacy Patient [facility] for Infectious Disease Phone: [phone] Fax:  [phone]

## 2020-01-12 ENCOUNTER — Telehealth: Payer: Self-pay | Admitting: Pharmacist

## 2020-01-12 NOTE — Telephone Encounter (Signed)
Patient is approved to receive Epclusa x 12 weeks for chronic Hepatitis C infection. Counseled patient to take Epclusa daily with or without food. Encouraged patient not to miss any doses and explained how their chance of cure could go down with each dose missed. Counseled patient on what to do if dose is missed - if it is closer to the missed dose take immediately; if closer to next dose skip dose and take the next dose at the usual time.   Counseled patient on common side effects such as headache, fatigue, and nausea and that these normally decrease with time. I reviewed patient medications and found no drug interactions. Discussed with patient that there are several drug interactions including acid suppressants. Instructed patient to call clinic if she wishes to start a new medication during course of therapy. Also advised patient to call if she experiences any side effects. Patient will follow-up with Dr. Elinor Parkinson.

## 2020-02-25 ENCOUNTER — Telehealth: Payer: Self-pay

## 2020-02-25 NOTE — Telephone Encounter (Signed)
[facility] Patient Advocate Encounter  Patient never started [place] her insurance ended on 01/09/20.  I spoke to the patient in January and she explained she was waiting on the card to come in the mail. I was never able to find insurance on Ms. Butterfield and each time I have reached out to her I had to leave a message.  Clearance Coots, CPhT Specialty Pharmacy Patient [facility] for Infectious Disease Phone: [phone] Fax:  [phone]

## 2020-04-08 ENCOUNTER — Telehealth: Payer: Self-pay

## 2020-04-08 DIAGNOSIS — R768 Other specified abnormal immunological findings in serum: Secondary | ICD-10-CM

## 2020-04-08 NOTE — Telephone Encounter (Signed)
Referral place in chart

## 2020-04-08 NOTE — Telephone Encounter (Signed)
Patient is ready to go to ID for her hep c. She had to put this on hold due to insurance. Referral has been place.

## 2020-04-20 ENCOUNTER — Other Ambulatory Visit (HOSPITAL_COMMUNITY): Payer: Self-pay

## 2020-04-20 ENCOUNTER — Other Ambulatory Visit: Payer: Self-pay

## 2020-04-20 ENCOUNTER — Ambulatory Visit (INDEPENDENT_AMBULATORY_CARE_PROVIDER_SITE_OTHER): Payer: 59 | Admitting: Infectious Diseases

## 2020-04-20 ENCOUNTER — Encounter: Payer: Self-pay | Admitting: Infectious Diseases

## 2020-04-20 VITALS — BP 132/76 | HR 118 | Temp 99.5°F | Wt 126.4 lb

## 2020-04-20 DIAGNOSIS — Z7689 Persons encountering health services in other specified circumstances: Secondary | ICD-10-CM | POA: Diagnosis not present

## 2020-04-20 DIAGNOSIS — Z789 Other specified health status: Secondary | ICD-10-CM | POA: Insufficient documentation

## 2020-04-20 DIAGNOSIS — B182 Chronic viral hepatitis C: Secondary | ICD-10-CM

## 2020-04-20 DIAGNOSIS — Z23 Encounter for immunization: Secondary | ICD-10-CM

## 2020-04-20 DIAGNOSIS — Z7289 Other problems related to lifestyle: Secondary | ICD-10-CM

## 2020-04-20 NOTE — Progress Notes (Addendum)
[facility] for Infectious Diseases                                      [address], [place], [place], [address]                                               Phn. [phone]; Fax: [phone]                                                               Date: 04/20/20 Reason for Visit: Hepatitis C Follow Up    HPI: Courtney Valdez is a 55 y.o.old female with PMH of HTN who is here for evaluation and management after recent diagnosis with Hepatitis C. She .was tested positive for HCV ab and HCV RNA in early September 2021 as part of routine screening by her PCP and did not have any knowledge about Hep C prior to that. 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 She does not do any needle sticks as part of her work. Denies ever using IVD in the past. Denies previous history of blood transfusions prior to 1992, sharing of toothbrushes/razors, or sexual contact with known positive partners.  No personal or family history of liver disease.  has not received treatment to date. She has been tested for HIV and is negative. She smokes 1-2 cigarettes in a week, drinks 2 wines in the week and vodka in the weekend. Denies using any drugs. Has not been sexually active for a while. She lives with her sister.   She has been taking Augmentin which was prescribed by her PCP for a traumatic injury at her left great toe after a chair fell on her foot. She feels a lot of better after being on the abx. The pain and tenderness has significantly got better and she is now able to walk more comfortably  04/20/20 Doing well, has not started [place] yet due to change in insurance. Willing to be started on tx. No new complaints. Discussed about need for repeat labs for repeat prior authorization. She is willing to be started on tx. Discussed with her regarding need for [place] every 6 months and Screening EGD for r/o varices. She is willing to get a PCP established for management  of her HTN.   ROS: Denies yellowish discoloration of sclera and skin, abdominal pain/distension, hematemesis.            Denies cough, fever, chills, nightsweats, nausea, vomiting, diarrhea, constipation, weight loss, recent hospitalizations, rashes, joint complaints, shortness of breath, chest pain, headaches, dysuria . Current Outpatient Medications on File Prior to Visit  Medication Sig Dispense Refill  . amLODipine (NORVASC) 5 MG tablet Take 5 mg by mouth daily.    . Cyanocobalamin (B-12 PO) Take 1 tablet by mouth daily.     . ferrous sulfate 325 (65 FE) MG tablet Take 325 mg by mouth daily.    Marland Kitchen ibuprofen (ADVIL,MOTRIN) 200 MG tablet Take 400 mg by mouth as needed. For pain      No current  facility-administered medications on file prior to visit.    No Known Allergies  Past Medical History:  Diagnosis Date  . Hypertension    Past Surgical History:  Procedure Laterality Date  . NO PAST SURGERIES     as of 08/19/19   Social History   Socioeconomic History  . Marital status: Single    Spouse name: Not on file  . Number of children: Not on file  . Years of education: Not on file  . Highest education level: Not on file  Occupational History  . Not on file  Tobacco Use  . Smoking status: Former Smoker    Types: Cigarettes  . Smokeless tobacco: Never Used  Vaping Use  . Vaping Use: Never used  Substance and Sexual Activity  . Alcohol use: Yes    Comment: socially   . Drug use: Not Currently  . Sexual activity: Yes    Partners: Male    Birth control/protection: None  Other Topics Concern  . Not on file  Social History Narrative  . Not on file   Social Determinants of Health   Financial Resource Strain: Not on file  Food Insecurity: Not on file  Transportation Needs: Not on file  Physical Activity: Not on file  Stress: Not on file  Social Connections: Not on file  Intimate Partner Violence: Not on file   Vitals  BP 132/76   Pulse (!) 118   Temp 99.5 F  (37.5 C) (Oral)   Wt 126 lb 6.4 oz (57.3 kg)   BMI 21.03 kg/m    Physical exam: Gen: Alert and oriented x 3, no acute distress HEENT: [place]/AT, PERL, no scleral icterus, no pale conjunctivae, hearing normal, oral mucosa moist Neck: Supple, no lymphadenopathy Cardio: Regular rate and rhythm; +S1 and S2; no murmurs, gallops, or rubs Resp: CTAB; no wheezes, rhonchi, or rales GI: Soft, nontender, nondistended, bowel sounds present Extremities: No cyanosis, clubbing, or edema; +2 PT and DP pulses Rt hand is deformed due to h/o prior injury during gymnastics  Skin: No rashes, lesions, or ecchymoses Neuro: No focal deficits Psych: Calm, cooperative  Laboratory  09/11/19 - HCV antibody positive 09/18/19 HCV RNA positive  CBC Latest Ref Rng & Units 10/30/2019 08/16/2016  WBC 3.8 - 10.8 Thousand/uL 6.3 6.4  Hemoglobin 11.7 - 15.5 g/dL 35.0 09.3  Hematocrit 81.8 - 45.0 % 39.7 41.9  Platelets 140 - 400 Thousand/uL 95(L) 107(L)   CMP Latest Ref Rng & Units 10/30/2019 10/30/2019 08/16/2016  Glucose 65 - 99 mg/dL 299(B) - 716(R)  BUN 7 - 25 mg/dL 8 - <6(V)  Creatinine 8.93 - 1.05 mg/dL 8.10 - 1.75  Sodium 102 - 146 mmol/L 141 - 141  Potassium 3.5 - 5.3 mmol/L 3.6 - 3.9  Chloride 98 - 110 mmol/L 106 - 109  CO2 20 - 32 mmol/L 30 - 20(L)  Calcium 8.6 - 10.4 mg/dL 8.9 - 9.4  Total Protein 6.1 - 8.1 g/dL 7.1 - -  Total Bilirubin 0.2 - 1.2 mg/dL 0.5 - -  AST 10 - 35 U/L 90(H) - -  ALT 6 - 29 U/L 35(H) 33(H) -     Assessment/Plan:  Problem List Items Addressed This Visit      Digestive   Chronic hepatitis C without hepatic coma (HCC) - Primary   Relevant Orders   Hepatitis C RNA quantitative   Protime-INR   Hepatic function panel   CBC   Hepatitis B surface antigen   Hepatitis B core antibody, total  HIV antibody (with reflex)   Ambulatory referral to Gastroenterology    Other Visit Diagnoses    Encounter to establish care       Relevant Orders   Ambulatory referral to Internal  Medicine      Hepatitis  ? Cirrhosis Prior authorization for Epclusa for 12 weeks, no DDIs with current medication  Fu in 1 month [place] every 6 months, next due in June -July 2022  GI referral for screening EGD for varices   Alcohol Use - Counseled  Immunization - Immune to Hepatitis A - Hep B Vaccine 1st dose   I spent 30  minutes with the patient including review of prior medical records with greater than 50% of time in face to face counsel of the patient.   Patient's labs were reviewed as well as his previous records. Patients questions were addressed and answered.   Odette Fraction, MD Infectious Diseases  Office phone [phone] Fax no. [phone]

## 2020-04-20 NOTE — Addendum Note (Signed)
Addended by: Lorenso Courier on: 04/20/2020 11:14 AM   Modules accepted: Orders

## 2020-04-21 ENCOUNTER — Telehealth: Payer: Self-pay

## 2020-04-21 ENCOUNTER — Other Ambulatory Visit: Payer: Self-pay | Admitting: Infectious Diseases

## 2020-04-21 MED ORDER — SOFOSBUVIR-VELPATASVIR 400-100 MG PO TABS: 1.0000 | ORAL_TABLET | Freq: Every day | ORAL | 2 refills | Status: DC

## 2020-04-21 NOTE — Telephone Encounter (Signed)
I attempted to call the patient to relay lab results. Patient did not answer and a voicemail was left for patient to call back. Courtney Valdez

## 2020-04-21 NOTE — Telephone Encounter (Signed)
-----   Message from Odette Fraction, MD sent at 04/20/2020  3:49 PM EDT ----- Her WBC is very high which was surprise to me. Would you please check with her if she is having any fevers, chills or any symptoms or signs concerning for infection? If nothing, then I would like to repeat CBC w diff in a week.

## 2020-04-21 NOTE — Telephone Encounter (Signed)
[facility] Patient Advocate Encounter  Patient will not need a PA for Epclusa 12 weeks. The prescription will need to go to [facility] Ph # [phone] Fax # [phone].   Once script is sent I will call for pricing and set up a delivery date as well as a follow up appointment.  Clearance Coots, CPhT Specialty Pharmacy Patient [facility] for Infectious Disease Phone: [phone] Fax:  [phone]

## 2020-04-23 LAB — HEPATIC FUNCTION PANEL
AG Ratio: 0.9 (calc) — ABNORMAL LOW (ref 1.0–2.5)
ALT: 30 U/L — ABNORMAL HIGH (ref 6–29)
AST: 48 U/L — ABNORMAL HIGH (ref 10–35)
Albumin: 3.1 g/dL — ABNORMAL LOW (ref 3.6–5.1)
Alkaline phosphatase (APISO): 91 U/L (ref 37–153)
Bilirubin, Direct: 0.8 mg/dL — ABNORMAL HIGH (ref 0.0–0.2)
Globulin: 3.5 g/dL (calc) (ref 1.9–3.7)
Indirect Bilirubin: 0.9 mg/dL (calc) (ref 0.2–1.2)
Total Bilirubin: 1.7 mg/dL — ABNORMAL HIGH (ref 0.2–1.2)
Total Protein: 6.6 g/dL (ref 6.1–8.1)

## 2020-04-23 LAB — HEPATITIS B CORE ANTIBODY, TOTAL: Hep B Core Total Ab: NONREACTIVE

## 2020-04-23 LAB — HEPATITIS C RNA QUANTITATIVE
HCV Quantitative Log: 6.11 log IU/mL — ABNORMAL HIGH
HCV RNA, PCR, QN: 1290000 IU/mL — ABNORMAL HIGH

## 2020-04-23 LAB — PROTIME-INR
INR: 1.3 — ABNORMAL HIGH
Prothrombin Time: 13.1 s — ABNORMAL HIGH (ref 9.0–11.5)

## 2020-04-23 LAB — CBC
HCT: 41.7 % (ref 35.0–45.0)
Hemoglobin: 14.2 g/dL (ref 11.7–15.5)
MCH: 30 pg (ref 27.0–33.0)
MCHC: 34.1 g/dL (ref 32.0–36.0)
MCV: 88 fL (ref 80.0–100.0)
MPV: 10.5 fL (ref 7.5–12.5)
Platelets: 102 10*3/uL — ABNORMAL LOW (ref 140–400)
RBC: 4.74 10*6/uL (ref 3.80–5.10)
RDW: 12.4 % (ref 11.0–15.0)
WBC: 29.7 10*3/uL — ABNORMAL HIGH (ref 3.8–10.8)

## 2020-04-23 LAB — HIV ANTIBODY (ROUTINE TESTING W REFLEX): HIV 1&2 Ab, 4th Generation: NONREACTIVE

## 2020-04-23 LAB — HEPATITIS B SURFACE ANTIGEN: Hepatitis B Surface Ag: NONREACTIVE

## 2020-04-29 ENCOUNTER — Telehealth: Payer: Self-pay

## 2020-04-29 NOTE — Telephone Encounter (Signed)
Received call from patient looking for update on referral status for her bilateral foot pain. RN provided patient with phone number for Internal Medicine so that she can call to establish care.   RN also provided patient with phone number to [facility] as she was referred there as well.   Sandie Ano, RN

## 2020-04-29 NOTE — Telephone Encounter (Signed)
[facility] Patient Advocate Encounter   Received notification from [employer] that prior authorization for Courtney Valdez is required.   PA submitted on 04/29/20 Key [id number] Status is pending    [facility] will continue to follow.   Clearance Coots, CPhT Specialty Pharmacy Patient [facility] for Infectious Disease Phone: [phone] Fax:  [phone]

## 2020-04-30 ENCOUNTER — Telehealth: Payer: Self-pay

## 2020-04-30 NOTE — Telephone Encounter (Signed)
[facility] Patient Advocate Encounter  Prior Authorization for Sofosbuvir-Velpatasvir has been approved.    PA# [id number] Effective dates: 04/29/20 through 07/22/20  I was able to get a coupon copay card  BIN: [id number] PCN:  [id number] Group: [id number] ID: [id number]  [facility] will continue to follow.  Clearance Coots, CPhT Specialty Pharmacy Patient [facility] for Infectious Disease Phone: [phone] Fax:  [phone]

## 2020-05-18 ENCOUNTER — Ambulatory Visit: Payer: 59 | Admitting: Infectious Diseases

## 2020-05-19 ENCOUNTER — Encounter: Payer: 59 | Admitting: Student

## 2020-05-19 ENCOUNTER — Telehealth: Payer: Self-pay | Admitting: *Deleted

## 2020-05-19 NOTE — Telephone Encounter (Signed)
Called patient left voice message for patient to call the clinic @ [phone] to reschedule her today's missed appointment .

## 2020-05-26 ENCOUNTER — Telehealth: Payer: Self-pay

## 2020-05-26 NOTE — Telephone Encounter (Signed)
Patient's pharmacy called to notify staff that patient reports missing 2 doses of Epclusa because they went out of town and forgot. Patient concerned about ramifications of missing doses. Forwarding to pharmacy team for advice.   Journee Kohen Loyola Mast, RN

## 2020-05-27 NOTE — Telephone Encounter (Signed)
Called patient to discuss. Advised to continue taking Epclusa and to make sure no other doses are missed during her treatment. Will check labs when she comes for her 5 week follow up visit with me.

## 2020-06-01 NOTE — Progress Notes (Signed)
   CC: Hypertension, foot pain, hepatitis c  HPI:  [name] is a 55 y.o. with a history of hepatitis C and hypertension who is presenting to discuss her hypertension, bilateral foot pain, and hepatitis C.  PSH: None PMH: Hep C, HTN Allergies: None Fam: Mom breast cancer. Father colon cancer. Brother has diabetes. Another brother has thyroid condition.  Social: Lives with fiance, in [place]. 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 Denies smoking. EtOH use, 1-2 beers per day. No recreational drugs.   Past Medical History:  Diagnosis Date  . Hypertension    Review of Systems:   Constitutional: Negative for chills and fever.  Respiratory: Negative for shortness of breath.   Cardiovascular: Negative for chest pain and leg swelling.  Gastrointestinal: Negative for abdominal pain, nausea and vomiting.  Neurological: Negative for dizziness and headaches.   Physical Exam:  Vitals:   06/02/20 0903  BP: (!) 154/86  Pulse: 93  Temp: 99.8 F (37.7 C)  TempSrc: Oral  SpO2: 99%  Weight: 126 lb 8 oz (57.4 kg)  Height: 5\' 5"  (1.651 m)   Physical Exam HENT:     Head: Normocephalic and atraumatic.  Eyes:     Conjunctiva/sclera: Conjunctivae normal.     Pupils: Pupils are equal, round, and reactive to light.  Neck:     Thyroid: No thyromegaly.  Cardiovascular:     Rate and Rhythm: Normal rate and regular rhythm.     Pulses:          Dorsalis pedis pulses are 2+ on the right side and 2+ on the left side.     Heart sounds: Normal heart sounds. No murmur heard. No friction rub. No gallop.   Pulmonary:     Effort: Pulmonary effort is normal. No respiratory distress.     Breath sounds: Normal breath sounds. No wheezing.  Abdominal:     General: Bowel sounds are normal. There is no distension.     Palpations: Abdomen is soft.  Musculoskeletal:        General: Normal range of motion.     Cervical back: Normal range of motion and neck supple.     Right foot:  Normal range of motion.     Left foot: Normal range of motion.  Feet:     Comments: Sensation to pin prink, vibration sense, and temperature intact and equal throughout Skin:    General: Skin is warm and dry.     Findings: No erythema.  Neurological:     Mental Status: She is alert and oriented to person, place, and time.     Gait: Gait is intact.  Psychiatric:        Mood and Affect: Mood and affect normal.      Assessment & Plan:   See Encounters Tab for problem based charting.  Patient discussed with Dr. 

## 2020-06-02 ENCOUNTER — Ambulatory Visit (INDEPENDENT_AMBULATORY_CARE_PROVIDER_SITE_OTHER): Payer: 59 | Admitting: Internal Medicine

## 2020-06-02 ENCOUNTER — Encounter: Payer: Self-pay | Admitting: Internal Medicine

## 2020-06-02 VITALS — BP 169/94 | HR 87 | Temp 99.8°F | Ht 65.0 in | Wt 126.5 lb

## 2020-06-02 DIAGNOSIS — K7460 Unspecified cirrhosis of liver: Secondary | ICD-10-CM

## 2020-06-02 DIAGNOSIS — D72829 Elevated white blood cell count, unspecified: Secondary | ICD-10-CM | POA: Diagnosis not present

## 2020-06-02 DIAGNOSIS — I10 Essential (primary) hypertension: Secondary | ICD-10-CM

## 2020-06-02 DIAGNOSIS — F1090 Alcohol use, unspecified, uncomplicated: Secondary | ICD-10-CM

## 2020-06-02 DIAGNOSIS — G5790 Unspecified mononeuropathy of unspecified lower limb: Secondary | ICD-10-CM | POA: Insufficient documentation

## 2020-06-02 DIAGNOSIS — B182 Chronic viral hepatitis C: Secondary | ICD-10-CM

## 2020-06-02 DIAGNOSIS — G629 Polyneuropathy, unspecified: Secondary | ICD-10-CM | POA: Diagnosis not present

## 2020-06-02 DIAGNOSIS — G5793 Unspecified mononeuropathy of bilateral lower limbs: Secondary | ICD-10-CM

## 2020-06-02 DIAGNOSIS — Z7289 Other problems related to lifestyle: Secondary | ICD-10-CM

## 2020-06-02 DIAGNOSIS — Z789 Other specified health status: Secondary | ICD-10-CM

## 2020-06-02 HISTORY — DX: Unspecified cirrhosis of liver: K74.60

## 2020-06-02 LAB — POCT GLYCOSYLATED HEMOGLOBIN (HGB A1C): Hemoglobin A1C: 4.9 % (ref 4.0–5.6)

## 2020-06-02 LAB — GLUCOSE, CAPILLARY: Glucose-Capillary: 123 mg/dL — ABNORMAL HIGH (ref 70–99)

## 2020-06-02 MED ORDER — LISINOPRIL 10 MG PO TABS: 10.0000 mg | ORAL_TABLET | Freq: Every day | ORAL | 5 refills | Status: DC

## 2020-06-02 MED ORDER — AMLODIPINE BESYLATE 5 MG PO TABS: 5.0000 mg | ORAL_TABLET | Freq: Every day | ORAL | 5 refills | Status: DC

## 2020-06-02 NOTE — Assessment & Plan Note (Signed)
For the past 1 to 2 months patient has been having a burning sensation in the bottom of the soles of her feet, it is a numb and burning sensation that can last all day.  The right is worse than the left.  It is worse with walking and at night.  She states she occasionally feels like she will trip over things.  It can last all day and is currently a about a 9 out of 10.  She has been using Aspercreme, Epson salt, and Biofreeze which she states helps.  Shaking her feet also helps.  She thinks it started after she had something fall on her right foot. She denies any fevers, chills, nausea, vomiting, shortness of breath, chest pain, domino pain, headaches, weakness, or other symptoms.  She denies any hand numbness or tingling.  She does endorse polyuria and nocturia, urinates about every hour and wakes up about 3-4 times at night to urinate.  Patient does have a history of hepatitis C and is currently getting treatment for this.  On exam her pinprick, temperature, and vibration sense are intact and equal throughout, she did report a burning sensation at the sole of her right foot. Differential for common causes of polyneuropathy include thyroid disorder, diabetic neuropathy,  B12 deficiency, trauma, or infectious causes such as hepatitis C. discussed that we will obtain labs to further evaluate and would recommend continuing treatment for hepatitis C to see if this would help.  -Check B12, A1c, TSH, CBC -Continue treatment for hepatitis C

## 2020-06-02 NOTE — Assessment & Plan Note (Signed)
Patient is currently drinking about 1-2 beers per day, she states that she is cut down about 1-2 beers +2 shots per day.  We discussed her liver disease and risk for worsening of her symptoms with continued alcohol use.  We discussed that there are medications that can help with alcohol cravings and alcohol cessation if she is interested.   -Encourage alcohol cessation -Can consider acamprosate if patient is interested in the future, naltrexone would not be a good option given her liver disease

## 2020-06-02 NOTE — Assessment & Plan Note (Signed)
Ultrasound on 12/26/19 showed echogenic liver parenchyma question fatty infiltration versus cirrhosis, no mass or contour nodularity seen, CBD 7 mm in diameter, hepatic elastography showed median K PA 12, suggestive of compensated advanced chronic liver disease but needs further testing. Labs 1 month ago showed albumin 3.1, T bili 1.7, direct bili 0.8, AST 48, ALT 30, INR 1.3, PT 13.  HCV RNA 1.2 million.  Hep B core and surface antigen nonreactive. Patient has a gastroenterology follow-up on June 9 to further discuss this. We discussed her results and the high concern for cirrhosis.  Discussed the importance of completing treatment for her hepatitis C and the importance of quitting alcohol..  Provided information on other things that could contribute to cirrhosis and ways to decrease the risk of complications.    -Follow-up with gastroenterology -Advised on alcohol cessation

## 2020-06-02 NOTE — Patient Instructions (Addendum)
Ms. Courtney Valdez,  It was a pleasure to see you today. Thank you for coming in.   Today we discussed your foot pain. I am checking some labs today to evaluate for other causes, this could be related to your hepatitis infection and may get better as this is treated.   We also discussed your high blood pressure. Please continue taking amlodipine daily. I have started you on a medication called lisinopril. Please continue checking your blood pressures at home.   We also discussed your liver issues. Please follow up with the gastroenterologist to further help with this. Please work on stopping drinking, this can put you at a higher risk of having complications.   Please return to clinic in 1 month or sooner if needed.   Thank you again for coming in.   Claudean Severance.D.

## 2020-06-03 LAB — CBC WITH DIFFERENTIAL/PLATELET
Basophils Absolute: 0.1 10*3/uL (ref 0.0–0.2)
Basos: 1 %
EOS (ABSOLUTE): 0.3 10*3/uL (ref 0.0–0.4)
Eos: 4 %
Hematocrit: 37.8 % (ref 34.0–46.6)
Hemoglobin: 13.1 g/dL (ref 11.1–15.9)
Immature Grans (Abs): 0.1 10*3/uL (ref 0.0–0.1)
Immature Granulocytes: 1 %
Lymphocytes Absolute: 0.7 10*3/uL (ref 0.7–3.1)
Lymphs: 9 %
MCH: 30.8 pg (ref 26.6–33.0)
MCHC: 34.7 g/dL (ref 31.5–35.7)
MCV: 89 fL (ref 79–97)
Monocytes Absolute: 0.4 10*3/uL (ref 0.1–0.9)
Monocytes: 5 %
Neutrophils Absolute: 6.6 10*3/uL (ref 1.4–7.0)
Neutrophils: 80 %
Platelets: 73 10*3/uL — CL (ref 150–450)
RBC: 4.25 x10E6/uL (ref 3.77–5.28)
RDW: 12.6 % (ref 11.7–15.4)
WBC: 8.2 10*3/uL (ref 3.4–10.8)

## 2020-06-03 LAB — VITAMIN B12: Vitamin B-12: 453 pg/mL (ref 232–1245)

## 2020-06-03 LAB — TSH: TSH: 0.51 u[IU]/mL (ref 0.450–4.500)

## 2020-06-03 NOTE — Progress Notes (Signed)
Internal Medicine Clinic Attending ° °Case discussed with Dr. Krienke  At the time of the visit.  We reviewed the resident’s history and exam and pertinent patient test results.  I agree with the assessment, diagnosis, and plan of care documented in the resident’s note.  °

## 2020-06-04 ENCOUNTER — Telehealth: Payer: Self-pay | Admitting: Internal Medicine

## 2020-06-04 NOTE — Telephone Encounter (Signed)
Pt rtn phone call about her lab work that was done on 06/02/2020.

## 2020-06-10 ENCOUNTER — Ambulatory Visit: Payer: 59 | Admitting: Pharmacist

## 2020-06-17 ENCOUNTER — Ambulatory Visit: Payer: 59 | Admitting: Pharmacist

## 2020-06-21 ENCOUNTER — Ambulatory Visit: Payer: 59 | Admitting: Pharmacist

## 2020-06-23 ENCOUNTER — Ambulatory Visit (INDEPENDENT_AMBULATORY_CARE_PROVIDER_SITE_OTHER): Payer: 59 | Admitting: Pharmacist

## 2020-06-23 ENCOUNTER — Other Ambulatory Visit: Payer: Self-pay

## 2020-06-23 DIAGNOSIS — Z23 Encounter for immunization: Secondary | ICD-10-CM | POA: Diagnosis not present

## 2020-06-23 DIAGNOSIS — B182 Chronic viral hepatitis C: Secondary | ICD-10-CM

## 2020-06-23 NOTE — Progress Notes (Signed)
HPI: Courtney Valdez is a 55 y.o. female who presents to the [facility] pharmacy clinic for Hepatitis C follow-up.  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   CYANOCOBALAMIN (B-12 PO)    Take 1 tablet by mouth daily.    FERROUS SULFATE 325 (65 FE) MG TABLET    Take 325 mg by mouth daily.   IBUPROFEN (ADVIL,MOTRIN) 200 MG TABLET    Take 400 mg by mouth as needed. For pain    LISINOPRIL (ZESTRIL) 10 MG TABLET    Take 1 tablet (10 mg total) by mouth daily.   SOFOSBUVIR-VELPATASVIR (EPCLUSA) 400-100 MG TABS    Take 1 tablet by mouth daily. Take 1 tablet by mouth daily.  Modified Medications   No medications on file  Discontinued Medications   No medications on file    Allergies: No Known Allergies  Past Medical History: Past Medical History:  Diagnosis Date   Hypertension     Social History: Social History   Socioeconomic History   Marital status: Single    Spouse name: Not on file   Number of children: Not on file   Years of education: Not on file   Highest education level: Not on file  Occupational History   Not on file  Tobacco Use   Smoking status: Former    Pack years: 0.00    Types: Cigarettes   Smokeless tobacco: Never  Vaping Use   Vaping Use: Never used  Substance and Sexual Activity   Alcohol use: Yes    Comment: socially    Drug use: Not Currently   Sexual activity: Yes    Partners: Male    Birth  control/protection: None  Other Topics Concern   Not on file  Social History Narrative   Not on file   Social Determinants of Health   Financial Resource Strain: Not on file  Food Insecurity: Not on file  Transportation Needs: Not on file  Physical Activity: Not on file  Stress: Not on file  Social Connections: Not on file    Labs: Hepatitis C Lab Results  Component Value Date   HCVGENOTYPE 1a 10/30/2019   HCVRNAPCRQN 1,290,000 (H) 04/20/2020   HCVRNAPCRQN 1,820,000 (H) 10/30/2019   FIBROSTAGE F3 10/30/2019   Hepatitis B Lab Results  Component Value Date   HEPBSAB NON-REACTIVE 10/30/2019   HEPBSAG NON-REACTIVE 04/20/2020   HEPBCAB NON-REACTIVE 04/20/2020   Hepatitis A Lab Results  Component Value Date   HAV REACTIVE (A) 10/30/2019   HIV Lab Results  Component Value Date   HIV NON-REACTIVE 04/20/2020   HIV Non Reactive 09/11/2019   Lab Results  Component Value Date   CREATININE 0.64 10/30/2019   CREATININE 0.63 08/16/2016   Lab Results  Component Value Date   AST 48 (H) 04/20/2020   AST 90 (H) 10/30/2019   ALT 30 (H) 04/20/2020   ALT 33 (H) 10/30/2019   ALT  35 (H) 10/30/2019   INR 1.3 (H) 04/20/2020   INR 1.2 (H) 10/30/2019    Assessment: Courtney Valdez presents for her first follow up visit after recently starting Epclusa for hepatitis C. She has been taking Epclusa for about 7 weeks. She takes it every day around 11am. Reports 3 missed doses total since start. She denies any adverse effects with Epclusa and is not taking any medications for acid reflux or indigestion. She is due to receive her second hepatitis B vaccine which will complete the series.   Plan: -Administered 2nd (of 2 dose series) hepatitis B vaccine in office today -Continue Epclusa 1 tab daily to complete 12 weeks of treatment -Counseled patient on importance of adherence -HCV RNA, CMET -F/u 08/13/20 for end of treatment visit, will also check hepatitis B surface antibody at that time to  confirm immunity  Pervis Hocking, PharmD PGY1 Pharmacy Resident 06/23/2020 9:08 AM

## 2020-06-25 LAB — COMPREHENSIVE METABOLIC PANEL
AG Ratio: 1 (calc) (ref 1.0–2.5)
ALT: 21 U/L (ref 6–29)
AST: 38 U/L — ABNORMAL HIGH (ref 10–35)
Albumin: 3.8 g/dL (ref 3.6–5.1)
Alkaline phosphatase (APISO): 93 U/L (ref 37–153)
BUN: 8 mg/dL (ref 7–25)
CO2: 27 mmol/L (ref 20–32)
Calcium: 9.1 mg/dL (ref 8.6–10.4)
Chloride: 106 mmol/L (ref 98–110)
Creat: 0.63 mg/dL (ref 0.50–1.05)
Globulin: 3.7 g/dL (calc) (ref 1.9–3.7)
Glucose, Bld: 109 mg/dL — ABNORMAL HIGH (ref 65–99)
Potassium: 3.5 mmol/L (ref 3.5–5.3)
Sodium: 143 mmol/L (ref 135–146)
Total Bilirubin: 0.9 mg/dL (ref 0.2–1.2)
Total Protein: 7.5 g/dL (ref 6.1–8.1)

## 2020-06-25 LAB — HEPATITIS C RNA QUANTITATIVE
HCV Quantitative Log: <1.18 log IU/mL
HCV RNA, PCR, QN: <15 IU/mL

## 2020-07-13 ENCOUNTER — Encounter: Payer: Self-pay | Admitting: *Deleted

## 2020-07-19 ENCOUNTER — Telehealth: Payer: Self-pay | Admitting: *Deleted

## 2020-07-19 ENCOUNTER — Encounter: Payer: 59 | Admitting: Student

## 2020-07-19 NOTE — Telephone Encounter (Signed)
Called patient and left voice message for patient to call the clinic at [phone] to reschedule her missed appointment.

## 2020-08-13 ENCOUNTER — Ambulatory Visit: Payer: 59 | Admitting: Pharmacist

## 2020-08-26 ENCOUNTER — Ambulatory Visit: Payer: 59 | Admitting: Pharmacist

## 2020-08-30 ENCOUNTER — Ambulatory Visit (INDEPENDENT_AMBULATORY_CARE_PROVIDER_SITE_OTHER): Payer: 59 | Admitting: Pharmacist

## 2020-08-30 ENCOUNTER — Other Ambulatory Visit: Payer: Self-pay

## 2020-08-30 DIAGNOSIS — B182 Chronic viral hepatitis C: Secondary | ICD-10-CM | POA: Diagnosis not present

## 2020-08-30 NOTE — Progress Notes (Signed)
08/30/2020  HPI: Courtney Valdez is a 55 y.o. female who presents to the [facility] pharmacy clinic for Hepatitis C follow-up.  Medication: Epclusa x 12 weeks  Start Date: 05/03/20  Hepatitis C Genotype: 1a  Fibrosis Score: F3  Hepatitis C RNA: 1,290,000 (04/20/20); <15 (06/23/20)  Patient Active Problem List   Diagnosis Date Noted   Hypertension 06/02/2020   Lower extremity neuropathy 06/02/2020   Cirrhosis of liver without ascites (HCC) 06/02/2020   Alcohol use 04/20/2020   Chronic hepatitis C without hepatic coma (HCC) 10/31/2019   Immunization due 10/31/2019    Patient's Medications  New Prescriptions   No medications on file  Previous Medications   AMLODIPINE (NORVASC) 5 MG TABLET    Take 1 tablet (5 mg total) by mouth daily.   CYANOCOBALAMIN (B-12 PO)    Take 1 tablet by mouth daily.    FERROUS SULFATE 325 (65 FE) MG TABLET    Take 325 mg by mouth daily.   IBUPROFEN (ADVIL,MOTRIN) 200 MG TABLET    Take 400 mg by mouth as needed. For pain    LISINOPRIL (ZESTRIL) 10 MG TABLET    Take 1 tablet (10 mg total) by mouth daily.   SOFOSBUVIR-VELPATASVIR (EPCLUSA) 400-100 MG TABS    Take 1 tablet by mouth daily. Take 1 tablet by mouth daily.  Modified Medications   No medications on file  Discontinued Medications   No medications on file    Allergies: No Known Allergies  Past Medical History: Past Medical History:  Diagnosis Date   Hypertension     Social History: Social History   Socioeconomic History   Marital status: Single    Spouse name: Not on file   Number of children: Not on file   Years of education: Not on file   Highest education level: Not on file  Occupational History   Not on file  Tobacco Use   Smoking status: Former    Types: Cigarettes   Smokeless tobacco: Never  Vaping Use   Vaping Use: Never used  Substance and Sexual Activity   Alcohol use: Yes    Comment: socially    Drug use: Not Currently   Sexual activity: Yes    Partners: Male     Birth control/protection: None  Other Topics Concern   Not on file  Social History Narrative   Not on file   Social Determinants of Health   Financial Resource Strain: Not on file  Food Insecurity: Not on file  Transportation Needs: Not on file  Physical Activity: Not on file  Stress: Not on file  Social Connections: Not on file    Labs: Hepatitis C Lab Results  Component Value Date   HCVGENOTYPE 1a 10/30/2019   HCVRNAPCRQN <15 06/23/2020   HCVRNAPCRQN 1,290,000 (H) 04/20/2020   HCVRNAPCRQN 1,820,000 (H) 10/30/2019   FIBROSTAGE F3 10/30/2019   Hepatitis B Lab Results  Component Value Date   HEPBSAB NON-REACTIVE 10/30/2019   HEPBSAG NON-REACTIVE 04/20/2020   HEPBCAB NON-REACTIVE 04/20/2020   Hepatitis A Lab Results  Component Value Date   HAV REACTIVE (A) 10/30/2019   HIV Lab Results  Component Value Date   HIV NON-REACTIVE 04/20/2020   HIV Non Reactive 09/11/2019   Lab Results  Component Value Date   CREATININE 0.63 06/23/2020   CREATININE 0.64 10/30/2019   CREATININE 0.63 08/16/2016   Lab Results  Component Value Date   AST 38 (H) 06/23/2020   AST 48 (H) 04/20/2020   AST 90 (H) 10/30/2019  ALT 21 06/23/2020   ALT 30 (H) 04/20/2020   ALT 33 (H) 10/30/2019   ALT 35 (H) 10/30/2019   INR 1.3 (H) 04/20/2020   INR 1.2 (H) 10/30/2019    Assessment: Courtney Valdez presents for end of treatment visit after finishing 12 weeks of Epclusa at the end of July. She missed a total of 3 doses over the course of the 12 week treatment. Reported adverse effect of diarrhea during treatment. No new medications. No questions or concerns today. HCV RNA was undetectable in June after taking Epclusa for 7 weeks. Will check for immunity to hepatitis B as she completed the series at her visit in June.    Plan: -Check HCV RNA, CMET, hepatitis B surface antibody today -F/u SVR12 cure visit with Dr. Elinor Parkinson 11/1 11am  Pervis Hocking, PharmD PGY2 [facility]  Resident 08/30/2020 10:39 AM

## 2020-08-31 ENCOUNTER — Other Ambulatory Visit: Payer: Self-pay

## 2020-08-31 ENCOUNTER — Encounter: Payer: Self-pay | Admitting: Internal Medicine

## 2020-08-31 ENCOUNTER — Ambulatory Visit (INDEPENDENT_AMBULATORY_CARE_PROVIDER_SITE_OTHER): Payer: 59 | Admitting: Internal Medicine

## 2020-08-31 VITALS — BP 194/81 | HR 83 | Temp 98.0°F | Ht 65.0 in | Wt 121.8 lb

## 2020-08-31 DIAGNOSIS — Z789 Other specified health status: Secondary | ICD-10-CM

## 2020-08-31 DIAGNOSIS — I10 Essential (primary) hypertension: Secondary | ICD-10-CM

## 2020-08-31 DIAGNOSIS — D696 Thrombocytopenia, unspecified: Secondary | ICD-10-CM | POA: Diagnosis not present

## 2020-08-31 DIAGNOSIS — G629 Polyneuropathy, unspecified: Secondary | ICD-10-CM

## 2020-08-31 DIAGNOSIS — G5793 Unspecified mononeuropathy of bilateral lower limbs: Secondary | ICD-10-CM | POA: Diagnosis not present

## 2020-08-31 DIAGNOSIS — Z7289 Other problems related to lifestyle: Secondary | ICD-10-CM

## 2020-08-31 LAB — COMPREHENSIVE METABOLIC PANEL
AG Ratio: 1.2 (calc) (ref 1.0–2.5)
ALT: 27 U/L (ref 6–29)
AST: 51 U/L — ABNORMAL HIGH (ref 10–35)
Albumin: 4.3 g/dL (ref 3.6–5.1)
Alkaline phosphatase (APISO): 86 U/L (ref 37–153)
BUN: 12 mg/dL (ref 7–25)
CO2: 23 mmol/L (ref 20–32)
Calcium: 9.1 mg/dL (ref 8.6–10.4)
Chloride: 109 mmol/L (ref 98–110)
Creat: 0.67 mg/dL (ref 0.50–1.03)
Globulin: 3.6 g/dL (calc) (ref 1.9–3.7)
Glucose, Bld: 98 mg/dL (ref 65–99)
Potassium: 3.5 mmol/L (ref 3.5–5.3)
Sodium: 144 mmol/L (ref 135–146)
Total Bilirubin: 0.4 mg/dL (ref 0.2–1.2)
Total Protein: 7.9 g/dL (ref 6.1–8.1)

## 2020-08-31 LAB — HEPATITIS C RNA QUANTITATIVE
HCV Quantitative Log: <1.18 log IU/mL
HCV RNA, PCR, QN: <15 IU/mL

## 2020-08-31 LAB — HEPATITIS B SURFACE ANTIBODY,QUALITATIVE: Hep B S Ab: REACTIVE — AB

## 2020-08-31 MED ORDER — AMLODIPINE BESYLATE 5 MG PO TABS: 5.0000 mg | ORAL_TABLET | Freq: Every day | ORAL | 5 refills | Status: DC

## 2020-08-31 MED ORDER — GABAPENTIN 100 MG PO CAPS: 100.0000 mg | ORAL_CAPSULE | Freq: Three times a day (TID) | ORAL | 0 refills | Status: DC

## 2020-08-31 MED ORDER — LISINOPRIL 20 MG PO TABS: 20.0000 mg | ORAL_TABLET | Freq: Every day | ORAL | 1 refills | Status: DC

## 2020-08-31 NOTE — Progress Notes (Addendum)
CC: Still having numbness in my feet   HPI:  Courtney Valdez is a 55 y.o. female with a history of HTN, alcohol use disorder, chronic hepatitis C, and cirrhosis who presents for follow-up of numbness in feet and concern about weight loss. She reports that the numbness and tingling sensation in both of her foot are unchanged since last visit. She sometimes feels unsteady when she stands up due to the paresthesia. She voices concern since she has lost 5lbs since her last visit 3 months ago. Her appetite and food intake have remained the same. She denies B symptoms including fever and night sweats. She does have a family history of colon cancer in her father, which was found to have recurred about 2 weeks ago. She reports feeling stressed by her father's recent diagnosis, as well as being worried about her mother who recently fell and broke some ribs. Her most recent colonoscopy was 08/25/2019 and was normal. She had a mammogram on 08/22/2019 which showed left breast asymmetry warranting further evaluation. It was followed by an ultrasound which showed no concerning abnormalities.    Review of Systems:   General: Negative for fever or chills. HEENT: Negative for cough or sore throat.  CV: Negative for chest pain or peripheral edema.  Pulm: Negative for shortness of breath.  GI: Positive for diarrhea. Negative for melena, nausea, vomiting, or abdominal pain.  Hem/Onc: Positive for easy bruising.  Neuro: Negative for headache. Positive for paresthesias.   Physical Exam:  Vitals:   08/31/20 1431 08/31/20 1509  BP: (!) 184/100 (!) 194/81  Pulse: 78 83  Temp: 98 F (36.7 C)   TempSrc: Oral   SpO2: 98%   Weight: 121 lb 12.8 oz (55.2 kg)   Height: 5\' 5"  (1.651 m)    Physical Exam Vitals reviewed.  Constitutional:      Appearance: Normal appearance.  HENT:     Head: Normocephalic and atraumatic.  Cardiovascular:     Rate and Rhythm: Normal rate and regular rhythm.     Pulses:           Dorsalis pedis pulses are 2+ on the right side and 2+ on the left side.       Posterior tibial pulses are 2+ on the right side and 2+ on the left side.  Pulmonary:     Effort: Pulmonary effort is normal.     Breath sounds: Normal breath sounds.  Abdominal:     General: Bowel sounds are normal.     Palpations: Abdomen is soft.  Musculoskeletal:        General: Normal range of motion.     Cervical back: Normal range of motion.  Feet:     Right foot:     Protective Sensation: 10 sites tested.  10 sites sensed.     Skin integrity: Skin integrity normal.     Toenail Condition: Right toenails are normal.     Left foot:     Protective Sensation: 10 sites tested.  10 sites sensed.     Skin integrity: Skin integrity normal.     Toenail Condition: Left toenails are normal.  Skin:    General: Skin is warm and dry.     Capillary Refill: Capillary refill takes less than 2 seconds.  Neurological:     General: No focal deficit present.     Mental Status: She is alert and oriented to person, place, and time.  Psychiatric:        Mood and Affect:  Mood normal.        Behavior: Behavior normal.     Assessment & Plan:   See Encounters Tab for problem based charting.  Patient discussed with Dr. Mayford Knife

## 2020-08-31 NOTE — Patient Instructions (Signed)
It was a pleasure meeting you! Today we talked about your high blood pressure, numbness in your feet, and diarrhea.   High blood pressure - We are increasing your Lisinopril to 20mg  every day. Please re-start the Amlodipine 5mg  as well. We would like to see you in 2 weeks to make sure the medicines are helping and check your kidney function.  Numbness - We have sent in a prescription for Gabapentin to help with the numbness.   Diarrhea - Since you are not feeling sick from your diarrhea and do not have any other symptoms, we do not recommend any medications at this time. It should go away with time. We will check if it has gone away at your visit in 2 weeks.   Follow-Up: 2 weeks

## 2020-08-31 NOTE — Assessment & Plan Note (Signed)
Patient drinks 1-2 beers daily. In the past she has drank 3-4 beers daily. We discussed that the alcohol is likely contributing to her other symptoms of peripheral neuropathy and continuing to cause damage to her liver. She stated that she will try to cut back.   Plan:  -Follow-up in 2 weeks -Discuss medication to help cessation at next visit

## 2020-08-31 NOTE — Assessment & Plan Note (Signed)
Vitals:   08/31/20 1431 08/31/20 1509  BP: (!) 184/100 (!) 194/81   Blood pressure significantly elevated today. She denies any symptoms including headache, blurred vision, chest pain, or shortness of breath. Patient reports she has been taking lisinopril 10mg  daily, has not been taking amlodipine. Will plan to increase lisinopril to 20mg  and restart amlodipine 5mg .   Plan:  -Increase lisinopril to 20mg  daily  -Restart amlodipine 5mg  daily  -Check BMP and renal function at next visit  -Follow-up in 2 weeks

## 2020-08-31 NOTE — Assessment & Plan Note (Addendum)
She continues to experience intermittent numbness/tingling in her feet occurring daily. It is bilateral and is present both on the dorsal and plantar surfaces around her big toe and her 2nd toe. It will occasionally radiate up to the lower third of her anterior shins. She states she sometimes feels unsteady when she stands up. She tried her mother's gabapentin, which seemed to help. TSH, B12, and A1c all wnl. CBC was notable for thrombocytopenia, which coincides with her liver disease. She finished her Hepatitis C treatment in June. On exam, position, vibration, pinpoint, and light touch sensation were all intact. The differential for her neuropathy includes bilateral medial plantar nerve compression, alcohol induced polyneuropathy, thiamine deficiency, and lumbar radiculopathy.   Plan: -Start gabapentin -Follow-up in 2 weeks

## 2020-08-31 NOTE — Assessment & Plan Note (Signed)
Patient's CBC from 05/2020 was notable for platelet count of 73. She also noted unexplained bruising on her arms. This is likely due to her cirrhosis. We will recheck her platelets at her next visit.  Plan:  -Obtain CBC at next visit  -Follow-up in 2 weeks

## 2020-09-01 NOTE — Progress Notes (Signed)
   I personally was present and performed or re-performed the history, physical exam and medical decision-making activities of this service and have verified that the service and findings are accurately documented in the student's note.  Courtney Valdez is a 55 year old female with a history of cirrhosis believed to be secondary to hepatitis C (recently completed treatment), hypertension, alcohol use disorder presenting for follow-up evaluation of her hypertension, weight loss, peripheral neuropathy and diarrhea for the past 2 weeks.  Her blood pressure was significantly above goal today.  She has only been taking lisinopril 10 mg, was not taking amlodipine 5 mg in addition to this.  Plan to restart amlodipine 5 mg and increase lisinopril to 20 mg with close follow-up.    She is concerned about her weight loss however per chart review she has lost about 5 pounds since her last visit in May.  Looking back further her weight has fluctuated however appears near baseline.  She denies any B symptoms and exam was not concerning for any other pathology.  She does endorse 2 weeks of diarrhea, 3 loose stools daily.  Denies any changes to her diets, medication or sick contacts.  Denies any significant abdominal pain, fevers or chills.  Recommended monitoring.  Regarding her peripheral neuropathy, she describes a numbness and tingling sensation of bilateral first and second toes both dorsal and plantar surfaces.  She states that the sensation runs down to her mid shin.  Her previous work-up was negative with an unremarkable B12, TSH, CBC and hemoglobin A1c.  Exam was also unremarkable with normal proprioception, monofilament testing and vibratory sensation.  Differential diagnosis does include alcohol induced neuropathy however the distribution of her neuropathy would not be explained by this.  Will examine further at next visit to rule out nerve root impingement more proximally of the lower spine versus Morton's  neuroma.  Maitri Schnoebelen N, DO 09/01/2020, 9:03 AM

## 2020-09-07 NOTE — Progress Notes (Signed)
Internal Medicine Clinic Attending ° °Case discussed with Dr. Rehman  At the time of the visit.  We reviewed the resident’s history and exam and pertinent patient test results.  I agree with the assessment, diagnosis, and plan of care documented in the resident’s note.  ° °

## 2020-09-14 ENCOUNTER — Encounter: Payer: 59 | Admitting: Internal Medicine

## 2020-09-14 ENCOUNTER — Telehealth: Payer: Self-pay

## 2020-09-14 NOTE — Telephone Encounter (Signed)
I Left a voice mail message for patient about missed appointment this morning.I left the clinic number for patient to call us back [place], [place] [id number]/202211:10 AM

## 2020-09-30 ENCOUNTER — Emergency Department (HOSPITAL_COMMUNITY): Payer: 59

## 2020-09-30 ENCOUNTER — Encounter (HOSPITAL_COMMUNITY): Payer: Self-pay | Admitting: *Deleted

## 2020-09-30 ENCOUNTER — Other Ambulatory Visit: Payer: Self-pay | Admitting: Internal Medicine

## 2020-09-30 ENCOUNTER — Emergency Department (HOSPITAL_COMMUNITY)
Admission: EM | Admit: 2020-09-30 | Discharge: 2020-09-30 | Disposition: A | Discharge status: Home / Self Care | Payer: 59 | Attending: Emergency Medicine | Admitting: Emergency Medicine

## 2020-09-30 ENCOUNTER — Other Ambulatory Visit: Payer: Self-pay

## 2020-09-30 DIAGNOSIS — Y92410 Unspecified street and highway as the place of occurrence of the external cause: Secondary | ICD-10-CM | POA: Diagnosis not present

## 2020-09-30 DIAGNOSIS — Z79899 Other long term (current) drug therapy: Secondary | ICD-10-CM | POA: Insufficient documentation

## 2020-09-30 DIAGNOSIS — Z87891 Personal history of nicotine dependence: Secondary | ICD-10-CM | POA: Diagnosis not present

## 2020-09-30 DIAGNOSIS — I10 Essential (primary) hypertension: Secondary | ICD-10-CM | POA: Insufficient documentation

## 2020-09-30 DIAGNOSIS — S20211A Contusion of right front wall of thorax, initial encounter: Secondary | ICD-10-CM | POA: Diagnosis not present

## 2020-09-30 DIAGNOSIS — S299XXA Unspecified injury of thorax, initial encounter: Secondary | ICD-10-CM | POA: Diagnosis present

## 2020-09-30 DIAGNOSIS — M545 Low back pain, unspecified: Secondary | ICD-10-CM | POA: Diagnosis not present

## 2020-09-30 DIAGNOSIS — G629 Polyneuropathy, unspecified: Secondary | ICD-10-CM

## 2020-09-30 MED ORDER — CELECOXIB 200 MG PO CAPS: 200.0000 mg | ORAL_CAPSULE | Freq: Two times a day (BID) | ORAL | 0 refills | Status: DC

## 2020-09-30 MED ORDER — CYCLOBENZAPRINE HCL 10 MG PO TABS: 5.0000 mg | ORAL_TABLET | Freq: Two times a day (BID) | ORAL | 0 refills | Status: DC | PRN
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
## 2020-09-30 NOTE — Discharge Instructions (Addendum)
Your rib is not broken. I recommend that you purchase Lidocaine patches to apply as directed to the Right rib cage. Apply ice. No heavy lifting or smoking.  Contact a health care provider if you have: Increased bruising or swelling. Pain that is not controlled with treatment. A fever. Get help right away if you: Have difficulty breathing or shortness of breath. Develop a continual cough, or you cough up thick or bloody mucus from your lungs (sputum). Feel nauseous or you vomit. Have pain in your abdomen.

## 2020-11-08 ENCOUNTER — Other Ambulatory Visit: Payer: Self-pay | Admitting: Internal Medicine

## 2020-11-08 DIAGNOSIS — G629 Polyneuropathy, unspecified: Secondary | ICD-10-CM

## 2020-11-09 ENCOUNTER — Ambulatory Visit: Payer: 59 | Admitting: Infectious Diseases

## 2020-11-12 ENCOUNTER — Telehealth: Payer: Self-pay

## 2020-11-12 NOTE — Telephone Encounter (Signed)
Courtney Valdez calling from pharmacy to report that patient only picked up 2 of the 3 Epclusa Rx. Please call [phone] option 1.

## 2020-11-16 NOTE — Telephone Encounter (Signed)
That's strange.. we saw her in August and Courtney Valdez was finished with treatment. Hopefully Courtney Valdez will reschedule her missed appointment with Dr. Elinor Parkinson so we can check labs to see if Courtney Valdez is cured.

## 2020-11-23 ENCOUNTER — Encounter: Payer: Self-pay | Admitting: Internal Medicine

## 2020-11-23 ENCOUNTER — Ambulatory Visit (INDEPENDENT_AMBULATORY_CARE_PROVIDER_SITE_OTHER): Payer: 59 | Admitting: Internal Medicine

## 2020-11-23 ENCOUNTER — Other Ambulatory Visit: Payer: Self-pay

## 2020-11-23 VITALS — BP 201/112 | HR 92 | Temp 98.4°F | Ht 65.0 in | Wt 122.7 lb

## 2020-11-23 DIAGNOSIS — B182 Chronic viral hepatitis C: Secondary | ICD-10-CM

## 2020-11-23 DIAGNOSIS — G629 Polyneuropathy, unspecified: Secondary | ICD-10-CM | POA: Diagnosis not present

## 2020-11-23 DIAGNOSIS — N289 Disorder of kidney and ureter, unspecified: Secondary | ICD-10-CM

## 2020-11-23 DIAGNOSIS — I10 Essential (primary) hypertension: Secondary | ICD-10-CM | POA: Diagnosis not present

## 2020-11-23 DIAGNOSIS — Z23 Encounter for immunization: Secondary | ICD-10-CM

## 2020-11-23 DIAGNOSIS — Z0000 Encounter for general adult medical examination without abnormal findings: Secondary | ICD-10-CM

## 2020-11-23 DIAGNOSIS — G5793 Unspecified mononeuropathy of bilateral lower limbs: Secondary | ICD-10-CM

## 2020-11-23 MED ORDER — AMLODIPINE BESYLATE 10 MG PO TABS: 10.0000 mg | ORAL_TABLET | Freq: Every day | ORAL | 3 refills | Status: DC

## 2020-11-23 MED ORDER — GABAPENTIN 100 MG PO CAPS: ORAL_CAPSULE | ORAL | 0 refills | Status: DC

## 2020-11-23 NOTE — Assessment & Plan Note (Addendum)
Today's Vitals   11/23/20 0924  BP: (!) 201/112  Pulse: 92  Temp: 98.4 F (36.9 C)  TempSrc: Oral  SpO2: 97%  Weight: 122 lb 11.2 oz (55.7 kg)  Height: 5\' 5"  (1.651 m)   Follow up for uncontrolled HTN from 8/23. Started back lisinopril 20 mg and amlodipine 5 mg.   BP uncontrolled today. Did not take her BP medications this AM. Not measuring at home, encouraged to start. BP remains not at goal on current regimen 2/2 poor medication compliance. Tolerating medication without adverse effects. Pt continues to try to make lifestyle modifications; counseled on the importance of daily exercise, low salt diet, and weight loss. Denies headaches, vision changes, lightheadedness, chest pain, SHOB, leg swelling or changes in speech. Exam benign and vitals otherwise wnl.    Continue lisinopril 20 mg qd BMP done given lisinopril started last visit, will f/u on results Increase amlodipine 5 to 10 mg qd Encouraged to continue lifestyle modifications BP log and bring to next visit   Addendum: Cr at baseline; continue lisinopril.

## 2020-11-23 NOTE — Progress Notes (Signed)
CC: HTN follow up, labs, gabepentin refill   HPI:  Courtney Valdez is a 55 y.o. female with a PMHx stated below and presents today for stated above. Please see the Encounters tab for problem-based Assessment & Plan for additional details.   Past Medical History:  Diagnosis Date   Hypertension     Current Outpatient Medications on File Prior to Visit  Medication Sig Dispense Refill   celecoxib (CELEBREX) 200 MG capsule Take 1 capsule (200 mg total) by mouth 2 (two) times daily. 20 capsule 0   Cyanocobalamin (B-12 PO) Take 1 tablet by mouth daily.      cyclobenzaprine (FLEXERIL) 10 MG tablet Take 0.5-1 tablets (5-10 mg total) by mouth 2 (two) times daily as needed for muscle spasms. 20 tablet 0   ferrous sulfate 325 (65 FE) MG tablet Take 325 mg by mouth daily.     ibuprofen (ADVIL,MOTRIN) 200 MG tablet Take 400 mg by mouth as needed. For pain      lisinopril (ZESTRIL) 20 MG tablet Take 1 tablet (20 mg total) by mouth daily. 90 tablet 1   Sofosbuvir-Velpatasvir (EPCLUSA) 400-100 MG TABS Take 1 tablet by mouth daily. Take 1 tablet by mouth daily. 28 tablet 2   No current facility-administered medications on file prior to visit.    Family History  Problem Relation Age of Onset   Colon cancer Father 36   Breast cancer Mother    Breast cancer Maternal Aunt    Colon polyps Neg Hx    Esophageal cancer Neg Hx    Rectal cancer Neg Hx    Stomach cancer Neg Hx     Social History   Socioeconomic History   Marital status: Single    Spouse name: Not on file   Number of children: Not on file   Years of education: Not on file   Highest education level: Not on file  Occupational History   Not on file  Tobacco Use   Smoking status: Former    Types: Cigarettes   Smokeless tobacco: Never  Vaping Use   Vaping Use: Never used  Substance and Sexual Activity   Alcohol use: Yes    Comment: Beer   Drug use: Not Currently   Sexual activity: Yes    Partners: Male    Birth  control/protection: None  Other Topics Concern   Not on file  Social History Narrative   Not on file   Social Determinants of Health   Financial Resource Strain: Not on file  Food Insecurity: Not on file  Transportation Needs: Not on file  Physical Activity: Not on file  Stress: Not on file  Social Connections: Not on file  Intimate Partner Violence: Not on file    Review of Systems: ROS negative except for what is noted on the assessment and plan.  Vitals:   11/23/20 0924  BP: (!) 201/112  Pulse: 92  Temp: 98.4 F (36.9 C)  TempSrc: Oral  SpO2: 97%  Weight: 122 lb 11.2 oz (55.7 kg)  Height: 5\' 5"  (1.651 m)     Physical Exam: Constitutional: alert, well-appearing, in NAD,  HENT: normocephalic, atraumatic, mucous membranes moist Eyes: conjunctiva non-erythematous, EOMI Cardiovascular: RRR, no m/r/g, non-edematous bilateral LE Pulmonary/Chest: normal work of breathing on RA, LCTAB Abdominal: soft, non-tender to palpation, non-distended Neurological: A&O x 3, 5/5 strength in bilateral upper and lower extremities   Assessment & Plan:   See Encounters Tab for problem based charting.  Patient seen with Dr.  Lajean Manes, MD  Internal Medicine Resident, PGY-1 [facility]

## 2020-11-23 NOTE — Patient Instructions (Signed)
Thank you, Ms.Linley Stetler for allowing [place] to provide your care today!  Today we discussed:  High blood pressure: your BP remains HIGH. Please start taking amlodipine 10 mg every day and lisinopril 20 mg daily. PLEASE do not miss any doses. Measure your BP at home and bring a log during the next visit in 2 weeks.   Chronic pain: Refilled your gabapentin   I will call you if any labs, tests, or imaging results require any further attention or action.   I have ordered the following labs for you:  Lab Orders         BMP8+Anion Gap      Referrals ordered today:   Referral Orders  No referral(s) requested today     I have ordered the following medication/changed the following medications:    Start the following medications: Meds ordered this encounter  Medications   gabapentin (NEURONTIN) 100 MG capsule    Sig: TAKE 1 CAPSULE (100 MG TOTAL) BY MOUTH THREE TIMES DAILY.    Dispense:  90 capsule    Refill:  0   amLODipine (NORVASC) 10 MG tablet    Sig: Take 1 tablet (10 mg total) by mouth daily.    Dispense:  30 tablet    Refill:  3     Follow up in: 2 weeks    Should you have any questions or concerns please call the internal medicine clinic at [phone].     Carmel Sacramento, MD  Internal Medicine Resident, PGY-1 [facility]

## 2020-11-23 NOTE — Assessment & Plan Note (Signed)
Flu vaccine given Pneumococcal vaccine given  Deferring pap smear at this time

## 2020-11-23 NOTE — Assessment & Plan Note (Signed)
Remains stable compared to prior visit on 08/31/20. Requesting Gabapentin refill.   Gabapentin refill placed

## 2020-11-23 NOTE — Assessment & Plan Note (Signed)
Following with ID; states she is making an appt with them to f/u on this. States she is drinking ~2 beers/day but continues to make efforts to reduce intake.

## 2020-11-25 LAB — BMP8+ANION GAP
Anion Gap: 19 mmol/L — ABNORMAL HIGH (ref 10.0–18.0)
BUN/Creatinine Ratio: 11 (ref 9–23)
BUN: 7 mg/dL (ref 6–24)
CO2: 24 mmol/L (ref 20–29)
Calcium: 9.2 mg/dL (ref 8.7–10.2)
Chloride: 98 mmol/L (ref 96–106)
Creatinine, Ser: 0.65 mg/dL (ref 0.57–1.00)
Glucose: 104 mg/dL — ABNORMAL HIGH (ref 70–99)
Potassium: 3.4 mmol/L — ABNORMAL LOW (ref 3.5–5.2)
Sodium: 141 mmol/L (ref 134–144)
eGFR: 103 mL/min/{1.73_m2} (ref >59)

## 2020-11-26 NOTE — Progress Notes (Signed)
Internal Medicine Clinic Attending  I saw and evaluated the patient.  I personally confirmed the key portions of the history and exam documented by Dr. Allena Katz and I reviewed pertinent patient test results.  The assessment, diagnosis, and plan were formulated together and I agree with the documentation in the resident's note. Repeat BMP at f/u visit 11/29.

## 2020-11-30 ENCOUNTER — Ambulatory Visit: Payer: 59 | Admitting: Infectious Diseases

## 2020-11-30 NOTE — Progress Notes (Incomplete)
[facility]                                      [address], [place], [place], [address]                                               Phn. [phone]; Fax: [phone]                                                               Date: 04/20/20 Reason for Visit: Hepatitis C Follow Up    HPI: Courtney Valdez is a 55 y.o.old female with PMH of HTN who is here for evaluation and management after recent diagnosis with Hepatitis C. She .was tested positive for HCV ab and HCV RNA in early September 2021 as part of routine screening by her PCP and did not have any knowledge about Hep C prior to that. 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 She does not do any needle sticks as part of her work. Denies ever using IVD in the past. Denies previous history of blood transfusions prior to 1992, sharing of toothbrushes/razors, or sexual contact with known positive partners.  No personal or family history of liver disease.  has not received treatment to date. She has been tested for HIV and is negative. She smokes 1-2 cigarettes in a week, drinks 2 wines in the week and vodka in the weekend. Denies using any drugs. Has not been sexually active for a while. She lives with her sister.   She has been taking Augmentin which was prescribed by her PCP for a traumatic injury at her left great toe after a chair fell on her foot. She feels a lot of better after being on the abx. The pain and tenderness has significantly got better and she is now able to walk more comfortably  04/20/20 Doing well, has not started [place] yet due to change in insurance. Willing to be started on tx. No new complaints. Discussed about need for repeat labs for repeat prior authorization. She is willing to be started on tx. Discussed with her regarding need for [place] every 6 months and Screening EGD for r/o varices. She is willing to get a PCP established for management  of her HTN.   ROS: Denies yellowish discoloration of sclera and skin, abdominal pain/distension, hematemesis.            Denies cough, fever, chills, nightsweats, nausea, vomiting, diarrhea, constipation, weight loss, recent hospitalizations, rashes, joint complaints, shortness of breath, chest pain, headaches, dysuria . Current Outpatient Medications on File Prior to Visit  Medication Sig Dispense Refill   amLODipine (NORVASC) 10 MG tablet Take 1 tablet (10 mg total) by mouth daily. 30 tablet 3   celecoxib (CELEBREX) 200 MG capsule Take 1 capsule (200 mg total) by mouth 2 (two) times daily. 20 capsule 0   Cyanocobalamin (B-12 PO) Take 1 tablet by mouth daily.      cyclobenzaprine (FLEXERIL) 10 MG tablet Take 0.5-1 tablets (5-10 mg total) by  mouth 2 (two) times daily as needed for muscle spasms. 20 tablet 0   ferrous sulfate 325 (65 FE) MG tablet Take 325 mg by mouth daily.     gabapentin (NEURONTIN) 100 MG capsule TAKE 1 CAPSULE (100 MG TOTAL) BY MOUTH THREE TIMES DAILY. 90 capsule 0   ibuprofen (ADVIL,MOTRIN) 200 MG tablet Take 400 mg by mouth as needed. For pain      lisinopril (ZESTRIL) 20 MG tablet Take 1 tablet (20 mg total) by mouth daily. 90 tablet 1   Sofosbuvir-Velpatasvir (EPCLUSA) 400-100 MG TABS Take 1 tablet by mouth daily. Take 1 tablet by mouth daily. 28 tablet 2   No current facility-administered medications on file prior to visit.    No Known Allergies  Past Medical History:  Diagnosis Date   Hypertension    Past Surgical History:  Procedure Laterality Date   NO PAST SURGERIES     as of 08/19/19   Social History   Socioeconomic History   Marital status: Single    Spouse name: Not on file   Number of children: Not on file   Years of education: Not on file   Highest education level: Not on file  Occupational History   Not on file  Tobacco Use   Smoking status: Former    Types: Cigarettes   Smokeless tobacco: Never  Vaping Use   Vaping Use: Never used   Substance and Sexual Activity   Alcohol use: Yes    Comment: Beer   Drug use: Not Currently   Sexual activity: Yes    Partners: Male    Birth control/protection: None  Other Topics Concern   Not on file  Social History Narrative   Not on file   Social Determinants of Health   Financial Resource Strain: Not on file  Food Insecurity: Not on file  Transportation Needs: Not on file  Physical Activity: Not on file  Stress: Not on file  Social Connections: Not on file  Intimate Partner Violence: Not on file   Vitals  There were no vitals taken for this visit.   Physical exam: Gen: Alert and oriented x 3, no acute distress HEENT: [place]/AT, PERL, no scleral icterus, no pale conjunctivae, hearing normal, oral mucosa moist Neck: Supple, no lymphadenopathy Cardio: Regular rate and rhythm; +S1 and S2; no murmurs, gallops, or rubs Resp: CTAB; no wheezes, rhonchi, or rales GI: Soft, nontender, nondistended, bowel sounds present Extremities: No cyanosis, clubbing, or edema; +2 PT and DP pulses Rt hand is deformed due to h/o prior injury during gymnastics  Skin: No rashes, lesions, or ecchymoses Neuro: No focal deficits Psych: Calm, cooperative  Laboratory  09/11/19 - HCV antibody positive 09/18/19 HCV RNA positive  CBC Latest Ref Rng & Units 06/02/2020 04/20/2020 10/30/2019  WBC 3.4 - 10.8 x10E3/uL 8.2 29.7(H) 6.3  Hemoglobin 11.1 - 15.9 g/dL 13.1 14.2 13.7  Hematocrit 34.0 - 46.6 % 37.8 41.7 39.7  Platelets 150 - 450 x10E3/uL 73(LL) 102(L) 95(L)   CMP Latest Ref Rng & Units 11/23/2020 08/30/2020 06/23/2020  Glucose 70 - 99 mg/dL 104(H) 98 109(H)  BUN 6 - 24 mg/dL 7 12 8   Creatinine 0.57 - 1.00 mg/dL 0.65 0.67 0.63  Sodium 134 - 144 mmol/L 141 144 143  Potassium 3.5 - 5.2 mmol/L 3.4(L) 3.5 3.5  Chloride 96 - 106 mmol/L 98 109 106  CO2 20 - 29 mmol/L 24 23 27   Calcium 8.7 - 10.2 mg/dL 9.2 9.1 9.1  Total Protein 6.1 - 8.1 g/dL -  7.9 7.5  Total Bilirubin 0.2 - 1.2 mg/dL - 0.4 0.9   AST 10 - 35 U/L - 51(H) 38(H)  ALT 6 - 29 U/L - 27 21     Assessment/Plan:=  Hepatitis  ? Cirrhosis ? Epclusa, missed doses  HCV RNA for SVR  US abdomen due  GI referral for screening EGD for varices  Fu in 6 months   Alcohol Use - Counseled  Immunization - Immune to Hepatitis A and Hepatitis B - s/p Flu vaccine   I spent 30  minutes with the patient including review of prior medical records with greater than 50% of time in face to face counsel of the patient.   Patient's labs were reviewed as well as his previous records. Patients questions were addressed and answered.   Odette Fraction, MD Infectious Diseases  Office phone [phone] Fax no. [phone]

## 2020-12-07 ENCOUNTER — Telehealth: Payer: Self-pay | Admitting: *Deleted

## 2020-12-07 ENCOUNTER — Encounter: Payer: 59 | Admitting: Internal Medicine

## 2020-12-07 NOTE — Telephone Encounter (Signed)
Call to patient about missed appointment this am.  Message eft to call Clinics to reschedule.  Angelina Ok, RN 12/07/2020 10:49 AM.

## 2021-01-03 ENCOUNTER — Other Ambulatory Visit: Payer: Self-pay | Admitting: Internal Medicine

## 2021-01-03 DIAGNOSIS — G629 Polyneuropathy, unspecified: Secondary | ICD-10-CM

## 2021-01-14 ENCOUNTER — Other Ambulatory Visit: Payer: Self-pay

## 2021-01-14 ENCOUNTER — Ambulatory Visit (INDEPENDENT_AMBULATORY_CARE_PROVIDER_SITE_OTHER): Payer: 59 | Admitting: Podiatry

## 2021-01-14 ENCOUNTER — Ambulatory Visit (INDEPENDENT_AMBULATORY_CARE_PROVIDER_SITE_OTHER): Payer: 59

## 2021-01-14 DIAGNOSIS — M7752 Other enthesopathy of left foot: Secondary | ICD-10-CM

## 2021-01-14 DIAGNOSIS — R200 Anesthesia of skin: Secondary | ICD-10-CM | POA: Diagnosis not present

## 2021-01-14 DIAGNOSIS — R202 Paresthesia of skin: Secondary | ICD-10-CM

## 2021-01-14 DIAGNOSIS — G629 Polyneuropathy, unspecified: Secondary | ICD-10-CM

## 2021-01-14 DIAGNOSIS — M7751 Other enthesopathy of right foot: Secondary | ICD-10-CM | POA: Diagnosis not present

## 2021-01-14 DIAGNOSIS — M779 Enthesopathy, unspecified: Secondary | ICD-10-CM

## 2021-01-14 MED ORDER — TRIAMCINOLONE ACETONIDE 10 MG/ML IJ SUSP
20.0000 mg | Freq: Once | INTRAMUSCULAR | Status: AC
  Administered 2021-01-14: 20 mg

## 2021-01-14 MED ORDER — GABAPENTIN 300 MG PO CAPS: 300.0000 mg | ORAL_CAPSULE | Freq: Three times a day (TID) | ORAL | 3 refills | Status: DC

## 2021-01-17 ENCOUNTER — Other Ambulatory Visit: Payer: Self-pay | Admitting: Podiatry

## 2021-01-17 DIAGNOSIS — M779 Enthesopathy, unspecified: Secondary | ICD-10-CM

## 2021-01-17 NOTE — Progress Notes (Signed)
Subjective:   Patient ID: Courtney Valdez, female   DOB: 56 y.o.   MRN: [id number]   HPI Patient presents with bilateral pain in the bottom of both feet with burning and tingling.  Patient states it is worse when she tries to get up on her toes and the pain has been more intense over the last couple months but the numbness tingling has been present for a long time.  Patient does not currently smoke and tries to be active   Review of Systems  All other systems reviewed and are negative.      Objective:  Physical Exam Vitals and nursing note reviewed.  Constitutional:      Appearance: She is well-developed.  Pulmonary:     Effort: Pulmonary effort is normal.  Musculoskeletal:        General: Normal range of motion.  Skin:    General: Skin is warm.  Neurological:     Mental Status: She is alert.    Neurovascular status intact muscle strength found to be adequate range of motion adequate.  Patient has a history of alcohol usage but no longer drinks to the same degree and does have discomfort around the second third metatarsal phalangeal joint bilateral with moderate hammertoe deformity and pressure occurring against the underlying metatarsal phalangeal joints.  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  May consider other treatments and also will increase her gabapentin slightly and see if this makes a difference for her  X-rays indicate no signs of advanced arthritis moderate digital contracture bilateral creating metatarsal phalangeal joint pressure

## 2021-02-11 ENCOUNTER — Ambulatory Visit: Payer: 59 | Admitting: Podiatry

## 2021-03-04 ENCOUNTER — Other Ambulatory Visit: Payer: Self-pay | Admitting: Internal Medicine

## 2021-03-04 DIAGNOSIS — I10 Essential (primary) hypertension: Secondary | ICD-10-CM

## 2021-03-14 ENCOUNTER — Encounter: Payer: Self-pay | Admitting: *Deleted

## 2021-04-08 ENCOUNTER — Ambulatory Visit: Payer: 59 | Admitting: Podiatry

## 2021-04-19 ENCOUNTER — Encounter: Payer: 59 | Admitting: Internal Medicine

## 2021-04-19 ENCOUNTER — Encounter: Payer: Self-pay | Admitting: Internal Medicine

## 2021-04-20 ENCOUNTER — Ambulatory Visit: Payer: 59 | Admitting: Podiatry

## 2021-05-16 ENCOUNTER — Other Ambulatory Visit: Payer: Self-pay | Admitting: Podiatry

## 2021-05-17 NOTE — Telephone Encounter (Signed)
Please advise 

## 2021-08-19 ENCOUNTER — Encounter: Payer: Self-pay | Admitting: Internal Medicine

## 2021-09-01 ENCOUNTER — Encounter: Payer: 59 | Admitting: Internal Medicine

## 2021-09-10 ENCOUNTER — Other Ambulatory Visit: Payer: Self-pay | Admitting: Internal Medicine

## 2021-09-10 DIAGNOSIS — I10 Essential (primary) hypertension: Secondary | ICD-10-CM

## 2021-09-29 ENCOUNTER — Ambulatory Visit: Payer: Commercial Managed Care - HMO | Admitting: Podiatry

## 2021-09-29 ENCOUNTER — Encounter: Payer: Self-pay | Admitting: Podiatry

## 2021-09-29 DIAGNOSIS — R202 Paresthesia of skin: Secondary | ICD-10-CM | POA: Diagnosis not present

## 2021-09-29 DIAGNOSIS — G629 Polyneuropathy, unspecified: Secondary | ICD-10-CM | POA: Diagnosis not present

## 2021-09-29 DIAGNOSIS — R200 Anesthesia of skin: Secondary | ICD-10-CM

## 2021-09-29 MED ORDER — GABAPENTIN 400 MG PO CAPS: 400.0000 mg | ORAL_CAPSULE | Freq: Three times a day (TID) | ORAL | 5 refills | Status: DC

## 2021-10-02 NOTE — Progress Notes (Signed)
Subjective:   Patient ID: Courtney Valdez, female   DOB: 56 y.o.   MRN: [id number]   HPI Patient states that she is getting numbness and tingling and states she no longer has gabapentin and needs more.  States that she could use a higher dose as she is doing well but is not fully controlled   ROS      Objective:  Physical Exam  Vascular status found it would be intact neurologically diminished sharp dull vibratory consistent with probable diabetic neuropathy.  Patient is found to have reasonable digital perfusion      Assessment:  Neuropathy left with also probability that she has issues secondary to alcohol usage with cirrhosis     Plan:  H&P reviewed and the importance of not drinking and we went ahead today and we upped her gabapentin to 400 mg 3 times daily and patient will follow-up with her family physician concerning this

## 2021-10-08 ENCOUNTER — Other Ambulatory Visit: Payer: Self-pay | Admitting: Internal Medicine

## 2021-10-08 DIAGNOSIS — I10 Essential (primary) hypertension: Secondary | ICD-10-CM

## 2021-11-03 ENCOUNTER — Encounter: Payer: Self-pay | Admitting: Student

## 2021-11-10 ENCOUNTER — Encounter: Payer: Self-pay | Admitting: Student

## 2021-11-11 ENCOUNTER — Other Ambulatory Visit: Payer: Self-pay | Admitting: Student

## 2021-11-11 DIAGNOSIS — I10 Essential (primary) hypertension: Secondary | ICD-10-CM

## 2021-11-11 NOTE — Telephone Encounter (Signed)
Next appt scheduled 11/28/21 with PCP.Marland Kitchen LOV was 11/23/20 Canceled last 2 appts.  Pharmacy comment: REQUEST FOR 90 DAYS PRESCRIPTION. DX Code Needed.

## 2021-11-28 ENCOUNTER — Ambulatory Visit (INDEPENDENT_AMBULATORY_CARE_PROVIDER_SITE_OTHER): Payer: Commercial Managed Care - HMO | Admitting: Internal Medicine

## 2021-11-28 VITALS — BP 133/75 | HR 84 | Temp 98.3°F | Ht 65.0 in | Wt 120.0 lb

## 2021-11-28 DIAGNOSIS — Z1231 Encounter for screening mammogram for malignant neoplasm of breast: Secondary | ICD-10-CM

## 2021-11-28 DIAGNOSIS — I10 Essential (primary) hypertension: Secondary | ICD-10-CM | POA: Diagnosis not present

## 2021-11-28 DIAGNOSIS — G5793 Unspecified mononeuropathy of bilateral lower limbs: Secondary | ICD-10-CM | POA: Diagnosis not present

## 2021-11-28 DIAGNOSIS — Z23 Encounter for immunization: Secondary | ICD-10-CM

## 2021-11-28 DIAGNOSIS — D696 Thrombocytopenia, unspecified: Secondary | ICD-10-CM

## 2021-11-28 DIAGNOSIS — Z87891 Personal history of nicotine dependence: Secondary | ICD-10-CM | POA: Diagnosis not present

## 2021-11-28 DIAGNOSIS — Z0000 Encounter for general adult medical examination without abnormal findings: Secondary | ICD-10-CM

## 2021-11-28 MED ORDER — GABAPENTIN 400 MG PO CAPS: 400.0000 mg | ORAL_CAPSULE | Freq: Three times a day (TID) | ORAL | 5 refills | Status: DC

## 2021-11-28 MED ORDER — FERROUS SULFATE 325 (65 FE) MG PO TABS: 325.0000 mg | ORAL_TABLET | Freq: Every day | ORAL | 11 refills | Status: DC

## 2021-11-28 MED ORDER — AMLODIPINE BESYLATE 10 MG PO TABS: 10.0000 mg | ORAL_TABLET | Freq: Every day | ORAL | 3 refills | Status: DC

## 2021-11-28 MED ORDER — LOSARTAN POTASSIUM 50 MG PO TABS: 50.0000 mg | ORAL_TABLET | Freq: Every day | ORAL | 11 refills | Status: DC

## 2021-11-28 NOTE — Assessment & Plan Note (Signed)
BP Readings from Last 3 Encounters:  11/28/21 133/75  11/23/20 (!) 201/112  09/30/20 (!) 149/93   Systolic blood pressure initially elevated to the 150s, but upon recheck improved to 133.  The patient takes amlodipine 10 mg daily and lisinopril 20 mg daily, but she has discontinued the lisinopril as she did not like the way it made her feel.  She has not taken her lisinopril in about 1 month.  Patient denies any headache, vision changes, dizziness, chest pain, shortness of breath, or leg swelling.  Plan: -Continue amlodipine 10 mg daily -Start losartan 50 mg daily -BMP checked today and again in 1 month at BP follow-up

## 2021-11-28 NOTE — Assessment & Plan Note (Signed)
The patient has chronic thrombocytopenia that is thought to be secondary to her cirrhosis.  Platelet count has been as low as 73 previously.  Plan: - Recheck CBC today

## 2021-11-28 NOTE — Assessment & Plan Note (Signed)
Previous work-up for neuropathy included normal B12, normal TSH, and normal A1c.  Neuropathy is likely secondary to hepatitis C.  The patient's symptoms remain stable compared to her last visit in November 2022.  Plan: -Refilled gabapentin 400 mg 3 times daily

## 2021-11-28 NOTE — Assessment & Plan Note (Signed)
-  Flu vaccine given today -Order placed for mammogram

## 2021-11-28 NOTE — Progress Notes (Signed)
CC: medication refills  HPI:  Courtney Valdez is a 56 y.o. female living with a history stated below and presents today for medication refill. Please see problem based assessment and plan for additional details.  Past Medical History:  Diagnosis Date   Hypertension     Current Outpatient Medications on File Prior to Visit  Medication Sig Dispense Refill   Cyanocobalamin (B-12 PO) Take 1 tablet by mouth daily.      ibuprofen (ADVIL,MOTRIN) 200 MG tablet Take 400 mg by mouth as needed. For pain      Sofosbuvir-Velpatasvir (EPCLUSA) 400-100 MG TABS Take 1 tablet by mouth daily. Take 1 tablet by mouth daily. 28 tablet 2   No current facility-administered medications on file prior to visit.    Family History  Problem Relation Age of Onset   Colon cancer Father 83   Breast cancer Mother    Breast cancer Maternal Aunt    Colon polyps Neg Hx    Esophageal cancer Neg Hx    Rectal cancer Neg Hx    Stomach cancer Neg Hx     Social History   Socioeconomic History   Marital status: Single    Spouse name: Not on file   Number of children: Not on file   Years of education: Not on file   Highest education level: Not on file  Occupational History   Not on file  Tobacco Use   Smoking status: Former    Types: Cigarettes   Smokeless tobacco: Never  Vaping Use   Vaping Use: Never used  Substance and Sexual Activity   Alcohol use: Yes    Comment: Beer   Drug use: Not Currently   Sexual activity: Yes    Partners: Male    Birth control/protection: None  Other Topics Concern   Not on file  Social History Narrative   Not on file   Social Determinants of Health   Financial Resource Strain: Not on file  Food Insecurity: Not on file  Transportation Needs: Not on file  Physical Activity: Not on file  Stress: Not on file  Social Connections: Not on file  Intimate Partner Violence: Not on file    Review of Systems: ROS negative except for what is noted on the assessment  and plan.  Vitals:   11/28/21 1317 11/28/21 1340  BP: (!) 153/82 133/75  Pulse: 90 84  Temp: 98.3 F (36.8 C)   TempSrc: Oral   SpO2: 97%   Weight: 120 lb (54.4 kg)   Height: 5\' 5"  (1.651 m)     Physical Exam: Constitutional: well-appearing female sitting in chair, in no acute distress Cardiovascular: regular rate and rhythm, no m/r/g Pulmonary/Chest: normal work of breathing on room air, lungs clear to auscultation bilaterally MSK: normal bulk and tone Neurological: alert & oriented x 3, no focal deficit Skin: warm and dry Psych: normal mood and behavior  Assessment & Plan:     Patient discussed with Dr.  Uncontrolled hypertension BP Readings from Last 3 Encounters:  11/28/21 133/75  11/23/20 (!) 201/112  09/30/20 (!) 149/93   Systolic blood pressure initially elevated to the 150s, but upon recheck improved to 133.  The patient takes amlodipine 10 mg daily and lisinopril 20 mg daily, but she has discontinued the lisinopril as she did not like the way it made her feel.  She has not taken her lisinopril in about 1 month.  Patient denies any headache, vision changes, dizziness, chest pain, shortness of breath, or  leg swelling.  Plan: -Continue amlodipine 10 mg daily -Start losartan 50 mg daily -BMP checked today and again in 1 month at BP follow-up   Lower extremity neuropathy Previous work-up for neuropathy included normal B12, normal TSH, and normal A1c.  Neuropathy is likely secondary to hepatitis C.  The patient's symptoms remain stable compared to her last visit in November 2022.  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  Platelet count has been as low as 73 previously.  Plan: - Recheck CBC today  Healthcare maintenance -Flu vaccine given today -Order placed for mammogram   Antino Mayabb, D.O. [facility],  PGY-2 Phone: [phone] Date 11/28/2021 Time 1:45 PM

## 2021-11-28 NOTE — Patient Instructions (Signed)
Thank you, Ms.Laikynn Wigger for allowing [place] to provide your care today. Today we discussed:  Blood pressure: Continue taking amlodipine 10 mg daily and please start losartan 50 mg daily.  Make an appointment in 1 month for follow-up, so we can make sure that we do not need to adjust any of these medications Flu shot today I have placed an order for you to get a mammogram Take gabapentin up to 3 times a day for your numbness/tingling  I have ordered the following labs for you:   Lab Orders         BMP8+Anion Gap         CBC no Diff       Referrals ordered today:   Referral Orders  No referral(s) requested today     I have ordered the following medication/changed the following medications:   Stop the following medications: Medications Discontinued During This Encounter  Medication Reason   lisinopril (ZESTRIL) 20 MG tablet    celecoxib (CELEBREX) 200 MG capsule    cyclobenzaprine (FLEXERIL) 10 MG tablet    ferrous sulfate 325 (65 FE) MG tablet Reorder   amLODipine (NORVASC) 10 MG tablet Reorder   gabapentin (NEURONTIN) 400 MG capsule Reorder     Start the following medications: Meds ordered this encounter  Medications   amLODipine (NORVASC) 10 MG tablet    Sig: Take 1 tablet (10 mg total) by mouth daily.    Dispense:  90 tablet    Refill:  3   losartan (COZAAR) 50 MG tablet    Sig: Take 1 tablet (50 mg total) by mouth daily.    Dispense:  30 tablet    Refill:  11   gabapentin (NEURONTIN) 400 MG capsule    Sig: Take 1 capsule (400 mg total) by mouth 3 (three) times daily.    Dispense:  90 capsule    Refill:  5   ferrous sulfate 325 (65 FE) MG tablet    Sig: Take 1 tablet (325 mg total) by mouth daily.    Dispense:  30 tablet    Refill:  11     Follow up:  1 month for BP follow up     Remember: To call and schedule your mammogram  Should you have any questions or concerns please call the internal medicine clinic at [phone].     Elza Rafter, D.O. [facility]

## 2021-11-29 LAB — BMP8+ANION GAP
Anion Gap: 22 mmol/L — ABNORMAL HIGH (ref 10.0–18.0)
BUN/Creatinine Ratio: 16 (ref 9–23)
BUN: 8 mg/dL (ref 6–24)
CO2: 20 mmol/L (ref 20–29)
Calcium: 9.4 mg/dL (ref 8.7–10.2)
Chloride: 100 mmol/L (ref 96–106)
Creatinine, Ser: 0.51 mg/dL — ABNORMAL LOW (ref 0.57–1.00)
Glucose: 79 mg/dL (ref 70–99)
Potassium: 3.5 mmol/L (ref 3.5–5.2)
Sodium: 142 mmol/L (ref 134–144)
eGFR: 109 mL/min/{1.73_m2} (ref >59)

## 2021-11-29 LAB — CBC
Hematocrit: 40.4 % (ref 34.0–46.6)
Hemoglobin: 13.9 g/dL (ref 11.1–15.9)
MCH: 31.2 pg (ref 26.6–33.0)
MCHC: 34.4 g/dL (ref 31.5–35.7)
MCV: 91 fL (ref 79–97)
Platelets: 104 10*3/uL — ABNORMAL LOW (ref 150–450)
RBC: 4.45 x10E6/uL (ref 3.77–5.28)
RDW: 12.5 % (ref 11.7–15.4)
WBC: 10.4 10*3/uL (ref 3.4–10.8)

## 2021-11-29 NOTE — Progress Notes (Signed)
Attempted to call patient regarding lab results. Cr is low at 0.51 (could be low secondary to liver disease). On CBC, platelet count is stable at 104. No change in therapy at this time - patient will follow up in 1 month for repeat BMP since starting losartan.

## 2021-12-06 ENCOUNTER — Other Ambulatory Visit: Payer: Self-pay

## 2021-12-06 ENCOUNTER — Encounter: Payer: Self-pay | Admitting: Infectious Diseases

## 2021-12-06 ENCOUNTER — Ambulatory Visit (INDEPENDENT_AMBULATORY_CARE_PROVIDER_SITE_OTHER): Payer: Commercial Managed Care - HMO | Admitting: Infectious Diseases

## 2021-12-06 VITALS — BP 160/90 | HR 84 | Resp 16 | Ht 65.0 in | Wt 123.0 lb

## 2021-12-06 DIAGNOSIS — K7460 Unspecified cirrhosis of liver: Secondary | ICD-10-CM | POA: Diagnosis not present

## 2021-12-06 DIAGNOSIS — Z789 Other specified health status: Secondary | ICD-10-CM | POA: Diagnosis not present

## 2021-12-06 DIAGNOSIS — Z7185 Encounter for immunization safety counseling: Secondary | ICD-10-CM

## 2021-12-06 DIAGNOSIS — B182 Chronic viral hepatitis C: Secondary | ICD-10-CM

## 2021-12-06 NOTE — Addendum Note (Signed)
Addended by: Dickie La on: 12/06/2021 09:05 AM   Modules accepted: Level of Service

## 2021-12-06 NOTE — Progress Notes (Signed)
Internal Medicine Clinic Attending ° °Case discussed with Dr. Atway  At the time of the visit.  We reviewed the resident’s history and exam and pertinent patient test results.  I agree with the assessment, diagnosis, and plan of care documented in the resident’s note.  °
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
## 2021-12-07 ENCOUNTER — Ambulatory Visit (HOSPITAL_COMMUNITY)
Admission: EM | Admit: 2021-12-07 | Discharge: 2021-12-07 | Disposition: A | Discharge status: Home / Self Care | Payer: Commercial Managed Care - HMO | Attending: Family Medicine | Admitting: Family Medicine

## 2021-12-07 ENCOUNTER — Encounter (HOSPITAL_COMMUNITY): Payer: Self-pay | Admitting: Emergency Medicine

## 2021-12-07 ENCOUNTER — Other Ambulatory Visit: Payer: Self-pay

## 2021-12-07 DIAGNOSIS — S161XXA Strain of muscle, fascia and tendon at neck level, initial encounter: Secondary | ICD-10-CM | POA: Diagnosis not present

## 2021-12-07 DIAGNOSIS — S39012A Strain of muscle, fascia and tendon of lower back, initial encounter: Secondary | ICD-10-CM

## 2021-12-07 MED ORDER — KETOROLAC TROMETHAMINE 60 MG/2ML IM SOLN
60.0000 mg | Freq: Once | INTRAMUSCULAR | Status: AC
  Administered 2021-12-07: 60 mg via INTRAMUSCULAR

## 2021-12-07 MED ORDER — NAPROXEN 500 MG PO TABS: 500.0000 mg | ORAL_TABLET | Freq: Two times a day (BID) | ORAL | 0 refills | Status: DC

## 2021-12-07 MED ORDER — KETOROLAC TROMETHAMINE 60 MG/2ML IM SOLN
INTRAMUSCULAR | Status: AC
  Filled 2021-12-07: qty 2

## 2021-12-07 MED ORDER — CYCLOBENZAPRINE HCL 5 MG PO TABS: ORAL_TABLET | ORAL | 0 refills | Status: DC
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
## 2021-12-07 NOTE — Discharge Instructions (Signed)
HOME CARE INSTRUCTIONS: For many people, back pain returns. Since low back pain is rarely dangerous, it is often a condition that people can learn to manage on their own. Please remain active. It is stressful on the back to sit or stand in one place. Do not sit, drive, or stand in one place for more than 30 minutes at a time. Take short walks on level surfaces as soon as pain allows. Try to increase the length of time you walk each day. Do not stay in bed. Resting more than 1 or 2 days can delay your recovery. Do not avoid exercise or work. Your body is made to move. It is not dangerous to be active, even though your back may hurt. Your back will likely heal faster if you return to being active before your pain is gone. Over-the-counter medicines to reduce pain and inflammation are often the most helpful.  SEEK MEDICAL CARE IF: You have pain that is not relieved with rest or medicine. You have pain that does not improve in 1 week. You have new symptoms. You are generally not feeling well.  SEEK IMMEDIATE MEDICAL CARE IF: You have pain that radiates from your back into your legs. You develop new bowel or bladder control problems. You have unusual weakness or numbness in your arms or legs. You develop nausea or vomiting. You develop abdominal pain. You feel faint.  Meds ordered this encounter  Medications   ketorolac (TORADOL) injection 60 mg

## 2021-12-08 LAB — HEPATITIS C RNA QUANTITATIVE
HCV Quantitative Log: <1.18 log IU/mL
HCV RNA, PCR, QN: <15 IU/mL
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
## 2021-12-13 ENCOUNTER — Ambulatory Visit (HOSPITAL_COMMUNITY): Payer: Commercial Managed Care - HMO

## 2021-12-13 ENCOUNTER — Encounter: Payer: Self-pay | Admitting: Internal Medicine

## 2021-12-14 ENCOUNTER — Telehealth: Payer: Self-pay

## 2021-12-14 NOTE — Telephone Encounter (Signed)
-----   Message from Odette Fraction, MD sent at 12/14/2021  8:05 AM EST ----- HCV RNA is negative  Please let her know that she is cured of her HCV infection

## 2021-12-14 NOTE — Telephone Encounter (Signed)
Called patient regarding lab results. Relayed providers message. Did not have any questions at this time.  Courtney Valdez, RMA

## 2021-12-16 ENCOUNTER — Ambulatory Visit (HOSPITAL_COMMUNITY)
Admission: RE | Admit: 2021-12-16 | Discharge: 2021-12-16 | Disposition: A | Discharge status: Home / Self Care | Payer: Commercial Managed Care - HMO | Source: Ambulatory Visit | Attending: Infectious Diseases | Admitting: Infectious Diseases

## 2021-12-16 DIAGNOSIS — B182 Chronic viral hepatitis C: Secondary | ICD-10-CM | POA: Insufficient documentation

## 2021-12-19 ENCOUNTER — Telehealth: Payer: Self-pay

## 2021-12-19 NOTE — Telephone Encounter (Signed)
-----   Message from Odette Fraction, MD sent at 12/19/2021  9:07 AM EST ----- Please let her know that US abdomen is suggestive of liver cirrhosis and she has been referred to hepatologist for concerns of same in her last clinic visit.

## 2021-12-28 ENCOUNTER — Ambulatory Visit (INDEPENDENT_AMBULATORY_CARE_PROVIDER_SITE_OTHER): Payer: Commercial Managed Care - HMO | Admitting: Student

## 2021-12-28 ENCOUNTER — Other Ambulatory Visit: Payer: Self-pay

## 2021-12-28 ENCOUNTER — Encounter: Payer: Self-pay | Admitting: Student

## 2021-12-28 VITALS — BP 139/68 | HR 87 | Temp 98.1°F | Ht 65.0 in | Wt 126.4 lb

## 2021-12-28 DIAGNOSIS — K7460 Unspecified cirrhosis of liver: Secondary | ICD-10-CM

## 2021-12-28 DIAGNOSIS — Z87891 Personal history of nicotine dependence: Secondary | ICD-10-CM

## 2021-12-28 DIAGNOSIS — G5793 Unspecified mononeuropathy of bilateral lower limbs: Secondary | ICD-10-CM

## 2021-12-28 DIAGNOSIS — G629 Polyneuropathy, unspecified: Secondary | ICD-10-CM

## 2021-12-28 DIAGNOSIS — I10 Essential (primary) hypertension: Secondary | ICD-10-CM

## 2021-12-28 LAB — PROTIME-INR
INR: 1.1 (ref 0.8–1.2)
Prothrombin Time: 14.4 seconds (ref 11.4–15.2)

## 2021-12-28 MED ORDER — GABAPENTIN 400 MG PO CAPS: 400.0000 mg | ORAL_CAPSULE | Freq: Three times a day (TID) | ORAL | 5 refills | Status: DC

## 2021-12-28 MED ORDER — LOSARTAN POTASSIUM 100 MG PO TABS: 50.0000 mg | ORAL_TABLET | Freq: Every day | ORAL | 5 refills | Status: DC

## 2021-12-28 NOTE — Patient Instructions (Signed)
For your blood pressure we will increase your losartan to 100mg  daily or  TWO tabs a day.

## 2021-12-29 LAB — CMP14 + ANION GAP
ALT: 25 IU/L (ref 0–32)
AST: 52 IU/L — ABNORMAL HIGH (ref 0–40)
Albumin/Globulin Ratio: 1.4 (ref 1.2–2.2)
Albumin: 4.3 g/dL (ref 3.8–4.9)
Alkaline Phosphatase: 85 IU/L (ref 44–121)
Anion Gap: 12 mmol/L (ref 10.0–18.0)
BUN/Creatinine Ratio: 17 (ref 9–23)
BUN: 8 mg/dL (ref 6–24)
Bilirubin Total: 0.6 mg/dL (ref 0.0–1.2)
CO2: 26 mmol/L (ref 20–29)
Calcium: 9 mg/dL (ref 8.7–10.2)
Chloride: 103 mmol/L (ref 96–106)
Creatinine, Ser: 0.46 mg/dL — ABNORMAL LOW (ref 0.57–1.00)
Globulin, Total: 3 g/dL (ref 1.5–4.5)
Glucose: 112 mg/dL — ABNORMAL HIGH (ref 70–99)
Potassium: 3.7 mmol/L (ref 3.5–5.2)
Sodium: 141 mmol/L (ref 134–144)
Total Protein: 7.3 g/dL (ref 6.0–8.5)
eGFR: 112 mL/min/{1.73_m2} (ref >59)

## 2021-12-29 NOTE — Progress Notes (Signed)
Internal Medicine Clinic Attending  Case discussed with the resident at the time of the visit.  We reviewed the resident's history and exam and pertinent patient test results.  I agree with the assessment, diagnosis, and plan of care documented in the resident's note.  

## 2021-12-29 NOTE — Progress Notes (Signed)
   CC: F/u HTN   HPI:  [name] is a 56 y.o. F with PMH per below who presents for follow up of her HTN.   She was started on losartan 50mg  qd 11/28/2021 in addition to her amlodipine 10mg  qd. She states that at home her SBP mostly run in the 140s. She denies symptoms of orthostasis.    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   Physical Exam:  Vitals:   12/28/21 1413 12/28/21 1417  BP: (!) 151/78 139/68  Pulse: 97 87  Temp: 98.1 F (36.7 C)   TempSrc: Oral   SpO2: 100%   Weight: 126 lb 6.4 oz (57.3 kg)   Height: 5\' 5"  (1.651 m)    Constitutional: Well-developed, well-nourished, and in no distress.  Cardiovascular: Normal rate, regular rhythm, intact distal pulses. No gallop and no friction rub.  No murmur heard. No lower extremity edema  Pulmonary: Non labored breathing on room air, no wheezing or rales  Abdominal: Soft. Normal bowel sounds. Non distended and non tender Musculoskeletal: Normal range of motion.        General: No tenderness or edema.  Neurological: Alert and oriented to person, place, and time. Non focal  Skin: Skin is warm and dry.    Assessment & Plan:   See Encounters Tab for problem based charting.  Patient discussed with Dr. 12/30/21

## 2021-12-29 NOTE — Assessment & Plan Note (Addendum)
Her BP is improved with starting losartan 50mg  in addition to amlodipine 10mg       12/28/2021    2:17 PM 12/28/2021    2:13 PM 12/07/2021   11:20 AM  Vitals with BMI  Height  5\' 5"    Weight  126 lbs 6 oz   BMI  21.03   Systolic 139 151 12/30/2021  Diastolic 68 78 93  Pulse 87 97 81   But her Bps remain in the 140s at home.   Her serum creatinine did not increase after starting losartan and remains low. Will increase her losartan to 100mg  qd.   She will return in 2 months for follow up of her BP

## 2021-12-29 NOTE — Assessment & Plan Note (Addendum)
Compensated. PT/INR WNL as well as albumin.   CTM

## 2021-12-29 NOTE — Assessment & Plan Note (Addendum)
Refilled her gabapentin. Worked up previously and felt to be due to her HCV.   Referred her to neurology for this.

## 2022-02-27 ENCOUNTER — Encounter: Payer: Commercial Managed Care - HMO | Admitting: Student

## 2022-03-08 ENCOUNTER — Encounter: Payer: Commercial Managed Care - HMO | Admitting: Student

## 2022-03-23 ENCOUNTER — Ambulatory Visit: Payer: Commercial Managed Care - HMO | Admitting: Diagnostic Neuroimaging

## 2022-03-23 ENCOUNTER — Encounter: Payer: Self-pay | Admitting: Diagnostic Neuroimaging

## 2022-04-03 NOTE — Progress Notes (Unsigned)
CC: hypertension  HPI:  Courtney Valdez is a 57 y.o. female living with a history stated below and presents today for follow-up of her blood pressure. Please see problem based assessment and plan for additional details.  Past Medical History:  Diagnosis Date   Chronic hepatitis C without hepatic coma ([place]) 10/31/2019   Labs today   Fu with Cassie after labs are back    Cirrhosis of liver without ascites ([place]) 06/02/2020   Hypertension     Current Outpatient Medications on File Prior to Visit  Medication Sig Dispense Refill   amLODipine (NORVASC) 10 MG tablet Take 1 tablet (10 mg total) by mouth daily. 90 tablet 3   Cyanocobalamin (B-12 PO) Take 1 tablet by mouth daily.      cyclobenzaprine (FLEXERIL) 5 MG tablet Take 1 tablet by mouth 3 times daily as needed for muscle spasm. Warning: May cause drowsiness. 21 tablet 0   ferrous sulfate 325 (65 FE) MG tablet Take 1 tablet (325 mg total) by mouth daily. 30 tablet 11   gabapentin (NEURONTIN) 400 MG capsule Take 1 capsule (400 mg total) by mouth 3 (three) times daily. 90 capsule 5   ibuprofen (ADVIL,MOTRIN) 200 MG tablet Take 400 mg by mouth as needed. For pain      losartan (COZAAR) 100 MG tablet Take 0.5 tablets (50 mg total) by mouth daily. 30 tablet 5   naproxen (NAPROSYN) 500 MG tablet Take 1 tablet (500 mg total) by mouth 2 (two) times daily with a meal. 20 tablet 0   No current facility-administered medications on file prior to visit.    Family History  Problem Relation Age of Onset   Colon cancer Father 9   Breast cancer Mother    Breast cancer Maternal Aunt    Colon polyps Neg Hx    Esophageal cancer Neg Hx    Rectal cancer Neg Hx    Stomach cancer Neg Hx     Social History   Socioeconomic History   Marital status: Single    Spouse name: Not on file   Number of children: Not on file   Years of education: Not on file   Highest education level: Not on file  Occupational History   Not on file  Tobacco Use    Smoking status: Former    Types: Cigarettes   Smokeless tobacco: Never  Vaping Use   Vaping Use: Never used  Substance and Sexual Activity   Alcohol use: Yes    Comment: Beer   Drug use: Not Currently   Sexual activity: Yes    Partners: Male    Birth control/protection: None  Other Topics Concern   Not on file  Social History Narrative   Not on file   Social Determinants of Health   Financial Resource Strain: Not on file  Food Insecurity: No Food Insecurity (12/28/2021)   Hunger Vital Sign    Worried About Running Out of Food in the Last Year: Never true    Ran Out of Food in the Last Year: Never true  Transportation Needs: Unmet Transportation Needs (12/28/2021)   PRAPARE - Hydrologist (Medical): Yes    Lack of Transportation (Non-Medical): Yes  Physical Activity: Not on file  Stress: Not on file  Social Connections: Moderately Isolated (12/28/2021)   Social Connection and Isolation Panel [NHANES]    Frequency of Communication with Friends and Family: More than three times a week    Frequency of Social Gatherings  with Friends and Family: More than three times a week    Attends Religious Services: More than 4 times per year    Active Member of Clubs or Organizations: No    Attends Archivist Meetings: Never    Marital Status: Never married  Intimate Partner Violence: Not At Risk (12/28/2021)   Humiliation, Afraid, Rape, and Kick questionnaire    Fear of Current or Ex-Partner: No    Emotionally Abused: No    Physically Abused: No    Sexually Abused: No    Review of Systems: ROS negative except for what is noted on the assessment and plan.  There were no vitals filed for this visit.  Physical Exam: Constitutional: well-appearing *** sitting in ***, in no acute distress HENT: normocephalic atraumatic, mucous membranes moist Eyes: conjunctiva non-erythematous Cardiovascular: regular rate and rhythm, no m/r/g Pulmonary/Chest:  normal work of breathing on room air, lungs clear to auscultation bilaterally Abdominal: soft, non-tender, non-distended MSK: normal bulk and tone Neurological: alert & oriented x 3, no focal deficit Skin: warm and dry Psych: normal mood and behavior  Assessment & Plan:   HTN: Losartan 100, amlodipine  Patient {GC/GE:[id number]::"discussed with","seen with"} Dr. OF:[id number]. Hoffman","Mullen","Narendra","Vincent","Guilloud","Lau","Machen"}  No problem-specific Assessment & Plan notes found for this encounter.   Buddy Duty, D.O. [facility], PGY-2 Phone: [phone] Date 04/03/2022 Time 1:33 PM

## 2022-04-04 ENCOUNTER — Encounter: Payer: Self-pay | Admitting: Internal Medicine

## 2022-04-04 ENCOUNTER — Ambulatory Visit (INDEPENDENT_AMBULATORY_CARE_PROVIDER_SITE_OTHER): Payer: Commercial Managed Care - HMO | Admitting: Internal Medicine

## 2022-04-04 VITALS — BP 150/92 | HR 89 | Temp 98.8°F | Ht 65.0 in | Wt 130.1 lb

## 2022-04-04 DIAGNOSIS — G629 Polyneuropathy, unspecified: Secondary | ICD-10-CM | POA: Diagnosis not present

## 2022-04-04 DIAGNOSIS — M545 Low back pain, unspecified: Secondary | ICD-10-CM

## 2022-04-04 DIAGNOSIS — I10 Essential (primary) hypertension: Secondary | ICD-10-CM | POA: Diagnosis not present

## 2022-04-04 DIAGNOSIS — M549 Dorsalgia, unspecified: Secondary | ICD-10-CM | POA: Insufficient documentation

## 2022-04-04 MED ORDER — CHLORTHALIDONE 25 MG PO TABS: 25.0000 mg | ORAL_TABLET | Freq: Every day | ORAL | 1 refills | Status: DC

## 2022-04-04 MED ORDER — CYCLOBENZAPRINE HCL 5 MG PO TABS: ORAL_TABLET | ORAL | 0 refills | Status: DC

## 2022-04-04 NOTE — Patient Instructions (Addendum)
Today we discussed the following:  1) Hypertension Please continue taking losartan 100 mg daily and amlodipine 10 mg daily. We have started you on chlorthalidone 25 mg daily.   2) Back spasms Please continue using your heating pad as needed. Avoid the use of ibuprofen on a daily basis. We have started you on a month of Flexeril (please take 1 tablet daily).   Please return in 1 month for follow up and lab work.

## 2022-04-04 NOTE — Progress Notes (Signed)
Subjective:   Patient ID: Courtney Valdez female   DOB: 06-May-1965 57 y.o.   MRN: [id number]  HPI: [name] is a 57 y.o. female with a PMHx as presented below who presents for follow up of her hypertension. Please see problem based assessment and plan for further work up and management.   Patient Active Problem List   Diagnosis Date Noted   Back pain 04/04/2022   Immunization counseling 12/06/2021   Hepatic cirrhosis ([place]) 12/06/2021   Thrombocytopenia ([place]) 08/31/2020   Uncontrolled hypertension 06/02/2020   Lower extremity neuropathy 06/02/2020   Alcohol use 04/20/2020   Healthcare maintenance 10/31/2019     Current Outpatient Medications  Medication Sig Dispense Refill   chlorthalidone (HYGROTON) 25 MG tablet Take 1 tablet (25 mg total) by mouth daily. 30 tablet 1   amLODipine (NORVASC) 10 MG tablet Take 1 tablet (10 mg total) by mouth daily. 90 tablet 3   Cyanocobalamin (B-12 PO) Take 1 tablet by mouth daily.      cyclobenzaprine (FLEXERIL) 5 MG tablet Take 1 tablet by mouth 3 times daily as needed for muscle spasm. Warning: May cause drowsiness. 21 tablet 0   ferrous sulfate 325 (65 FE) MG tablet Take 1 tablet (325 mg total) by mouth daily. 30 tablet 11   gabapentin (NEURONTIN) 400 MG capsule Take 1 capsule (400 mg total) by mouth 3 (three) times daily. 90 capsule 5   ibuprofen (ADVIL,MOTRIN) 200 MG tablet Take 400 mg by mouth as needed. For pain      losartan (COZAAR) 100 MG tablet Take 0.5 tablets (50 mg total) by mouth daily. 30 tablet 5   naproxen (NAPROSYN) 500 MG tablet Take 1 tablet (500 mg total) by mouth 2 (two) times daily with a meal. 20 tablet 0   No current facility-administered medications for this visit.     Review of Systems: Pertinent ROS present in the A&P. Otherwise negative.   Objective:   Physical Exam: Vitals:   04/04/22 0907 04/04/22 0930  BP: (!) 169/99 (!) 150/92  Pulse: 95 89  Temp: 98.8 F (37.1 C)   TempSrc: Oral   SpO2: 99%    Weight: 130 lb 1.6 oz (59 kg)   Height: 5\' 5"  (1.651 m)    Constitutional: Well-developed, well-nourished, and in no distress.  Cardiovascular: Normal rate, regular rhythm No murmur heard. No lower extremity edema  Pulmonary: Non labored breathing on room air, no wheezing or rales Back: No obvious deformity, overlying skin changes, or tenderness to palpation      Neurological: Alert and oriented to person, place, and time. Non focal  Skin: Skin is warm and dry.   Assessment & Plan:   Uncontrolled hypertension BP 150/92 in the office today. Blood pressure readings at home have been around [id number] systolic which patient checks around every other day. She is compliant on her losartan 100 mg and amlodipine 10mg . Denies heache, dizziness, chest pain, palpitations, or abdominal pain. Given BP still not within goal range, will add thiazide diuretic as next line agent. BMP in December was within normal limits. Will recheck labs and follow up in 1 month.  > losartan 100 mg daily > amlodipine 10 mg daily > chlorthalidone 25 mg daily  Back pain Pt reports 1 month of intermittent spasms of the left lower back. States the pain is non-radiating and worse with stress. Rates pain 9 out of 10 when present. She does not experience the pain every day. Denies any recent trauma or  heavy lifting. Symptoms are slightly alleviated with heating pad and ibuprofen. Exam reveals no obvious deformity, overlying skin changes, or tenderness to palpation. Given description, suspect muscle strain as etiology. Pt mentioned she is taking ibuprofen on a daily basis. She was advised against this today. Encouraged her to continue use of her heating pad. Will also prescribe 1 month course of Flexeril for symptoms relief and follow up at next visit in 1 month.  > heating pad > Flexeril daily

## 2022-04-04 NOTE — Assessment & Plan Note (Addendum)
Pt reports 1 month of intermittent spasms of the left lower back. States the pain is non-radiating and worse with stress. Rates pain 9 out of 10 when present. She does not experience the pain every day. Denies any recent trauma or heavy lifting. Symptoms are slightly alleviated with heating pad and ibuprofen. Exam reveals no obvious deformity, overlying skin changes, or tenderness to palpation. Given description, suspect muscle strain as etiology. Pt mentioned she is taking ibuprofen on a daily basis. She was advised against this today. Encouraged her to continue use of her heating pad. Will also prescribe 1 month course of Flexeril for symptoms relief and follow up at next visit in 1 month.  > heating pad > Flexeril daily

## 2022-04-04 NOTE — Assessment & Plan Note (Signed)
BP 150/92 in the office today. Blood pressure readings at home have been around A999333 systolic which patient checks around every other day. She is compliant on her losartan 100 mg and amlodipine 10mg . Denies heache, dizziness, chest pain, palpitations, or abdominal pain. Given BP still not within goal range, will add thiazide diuretic as next line agent. BMP in December was within normal limits. Will recheck labs and follow up in 1 month.  > losartan 100 mg daily > amlodipine 10 mg daily > chlorthalidone 25 mg daily

## 2022-04-12 NOTE — Progress Notes (Signed)
Internal Medicine Clinic Attending  Case discussed with Dr. Raymondo Band and McIntosh  at the time of the visit.  We reviewed the resident's history and exam and pertinent patient test results.  I agree with the assessment, diagnosis, and plan of care documented in the resident's note.

## 2022-05-03 ENCOUNTER — Other Ambulatory Visit: Payer: Self-pay

## 2022-05-03 DIAGNOSIS — I10 Essential (primary) hypertension: Secondary | ICD-10-CM

## 2022-05-03 MED ORDER — CHLORTHALIDONE 25 MG PO TABS: 25.0000 mg | ORAL_TABLET | Freq: Every day | ORAL | 3 refills | Status: DC

## 2022-05-03 MED ORDER — AMLODIPINE BESYLATE 10 MG PO TABS: 10.0000 mg | ORAL_TABLET | Freq: Every day | ORAL | 3 refills | Status: DC

## 2022-05-24 ENCOUNTER — Ambulatory Visit: Payer: Commercial Managed Care - HMO | Admitting: Diagnostic Neuroimaging

## 2022-05-24 ENCOUNTER — Encounter: Payer: Self-pay | Admitting: Diagnostic Neuroimaging

## 2022-05-24 VITALS — BP 133/86 | HR 68 | Ht 65.0 in | Wt 129.0 lb

## 2022-05-24 DIAGNOSIS — G629 Polyneuropathy, unspecified: Secondary | ICD-10-CM

## 2022-05-24 NOTE — Progress Notes (Signed)
[facility]  PATIENT: Courtney Valdez DOB: Aug 24, 1965  REFERRING CLINICIAN: Gust Rung, DO HISTORY FROM: patient  REASON FOR VISIT: new consult   HISTORICAL  CHIEF COMPLAINT:  Chief Complaint  Patient presents with   Numbness    Rm  6 alone Pt is well, reports she has been having tingling, numbness and shoot pain in both legs for about a year. She states she feels like she is walking on rocks.     HISTORY OF PRESENT ILLNESS:   57 year old female here for evaluation of numbness and tingling.  Patient reports numbness and tingling in her feet and legs for about 1 year or longer.  Previously diagnosed with neuropathy without specific cause found.  Has been on gabapentin without significant relief.  Has seen podiatry and PCP in the past.  No issues with fingers or hands.  No issues with neck or low back.   REVIEW OF SYSTEMS: Full 14 system review of systems performed and negative with exception of: As per HPI.  ALLERGIES: No Known Allergies  HOME MEDICATIONS: Outpatient Medications Prior to Visit  Medication Sig Dispense Refill   amLODipine (NORVASC) 10 MG tablet Take 1 tablet (10 mg total) by mouth daily. 90 tablet 3   chlorthalidone (HYGROTON) 25 MG tablet Take 1 tablet (25 mg total) by mouth daily. 90 tablet 3   Cyanocobalamin (B-12 PO) Take 1 tablet by mouth daily.      cyclobenzaprine (FLEXERIL) 5 MG tablet Take 1 tablet by mouth 3 times daily as needed for muscle spasm. Warning: May cause drowsiness. 21 tablet 0   Ferrous Sulfate (IRON) 90 (18 Fe) MG TABS Take by mouth daily.     gabapentin (NEURONTIN) 400 MG capsule Take 1 capsule (400 mg total) by mouth 3 (three) times daily. 90 capsule 5   ibuprofen (ADVIL,MOTRIN) 200 MG tablet Take 400 mg by mouth as needed. For pain      losartan (COZAAR) 100 MG tablet Take 0.5 tablets (50 mg total) by mouth daily. 30 tablet 5   ferrous sulfate 325 (65 FE) MG tablet Take 1 tablet (325 mg total) by mouth daily.  (Patient not taking: Reported on 05/24/2022) 30 tablet 11   Iron-Vit C-Vit B12-Folic Acid (IRON 100 PLUS PO) Take by mouth daily. (Patient not taking: Reported on 05/24/2022)     naproxen (NAPROSYN) 500 MG tablet Take 1 tablet (500 mg total) by mouth 2 (two) times daily with a meal. (Patient not taking: Reported on 05/24/2022) 20 tablet 0   No facility-administered medications prior to visit.    PAST MEDICAL HISTORY: Past Medical History:  Diagnosis Date   Chronic hepatitis C without hepatic coma (HCC) 10/31/2019   Labs today   Fu with Cassie after labs are back    Cirrhosis of liver without ascites (HCC) 06/02/2020   Hypertension     PAST SURGICAL HISTORY: Past Surgical History:  Procedure Laterality Date   NO PAST SURGERIES     as of 08/19/19    FAMILY HISTORY: Family History  Problem Relation Age of Onset   Colon cancer Father 47   Breast cancer Mother    Breast cancer Maternal Aunt    Colon polyps Neg Hx    Esophageal cancer Neg Hx    Rectal cancer Neg Hx    Stomach cancer Neg Hx     SOCIAL HISTORY: Social History   Socioeconomic History   Marital status: Single    Spouse name: Not on file   Number of  children: Not on file   Years of education: Not on file   Highest education level: Not on file  Occupational History   Not on file  Tobacco Use   Smoking status: Some Days    Types: Cigarettes   Smokeless tobacco: Never   Tobacco comments:    Might take a "puff" here an there  Vaping Use   Vaping Use: Never used  Substance and Sexual Activity   Alcohol use: Yes    Comment: Beer   Drug use: Not Currently   Sexual activity: Yes    Partners: Male    Birth control/protection: None, Rhythm  Other Topics Concern   Not on file  Social History Narrative   Not on file   Social Determinants of Health   Financial Resource Strain: Not on file  Food Insecurity: No Food Insecurity (12/28/2021)   Hunger Vital Sign    Worried About Running Out of Food in the Last  Year: Never true    Ran Out of Food in the Last Year: Never true  Transportation Needs: Unmet Transportation Needs (12/28/2021)   PRAPARE - Administrator, Civil Service (Medical): Yes    Lack of Transportation (Non-Medical): Yes  Physical Activity: Not on file  Stress: Not on file  Social Connections: Moderately Isolated (12/28/2021)   Social Connection and Isolation Panel [NHANES]    Frequency of Communication with Friends and Family: More than three times a week    Frequency of Social Gatherings with Friends and Family: More than three times a week    Attends Religious Services: More than 4 times per year    Active Member of [employer] or Organizations: No    Attends Banker Meetings: Never    Marital Status: Never married  Intimate Partner Violence: Not At Risk (12/28/2021)   Humiliation, Afraid, Rape, and Kick questionnaire    Fear of Current or Ex-Partner: No    Emotionally Abused: No    Physically Abused: No    Sexually Abused: No     PHYSICAL EXAM  GENERAL EXAM/CONSTITUTIONAL: Vitals:  Vitals:   05/24/22 1153  BP: 133/86  Pulse: 68  Weight: 129 lb (58.5 kg)  Height: 5\' 5"  (1.651 m)   Body mass index is 21.47 kg/m. Wt Readings from Last 3 Encounters:  05/24/22 129 lb (58.5 kg)  04/04/22 130 lb 1.6 oz (59 kg)  12/28/21 126 lb 6.4 oz (57.3 kg)   Patient is in no distress; well developed, nourished and groomed; neck is supple  CARDIOVASCULAR: Examination of carotid arteries is normal; no carotid bruits Regular rate and rhythm, no murmurs Examination of peripheral vascular system by observation and palpation is normal  EYES: Ophthalmoscopic exam of optic discs and posterior segments is normal; no papilledema or hemorrhages No results found.  MUSCULOSKELETAL: Gait, strength, tone, movements noted in Neurologic exam below  NEUROLOGIC: MENTAL STATUS:      No data to display         awake, alert, oriented to person, place and  time recent and remote memory intact normal attention and concentration language fluent, comprehension intact, naming intact fund of knowledge appropriate  CRANIAL NERVE:  2nd - no papilledema on fundoscopic exam 2nd, 3rd, 4th, 6th - pupils equal and reactive to light, visual fields full to confrontation, extraocular muscles intact, no nystagmus 5th - facial sensation symmetric 7th - facial strength symmetric 8th - hearing intact 9th - palate elevates symmetrically, uvula midline 11th - shoulder shrug symmetric 12th -  tongue protrusion midline  MOTOR:  normal bulk and tone, full strength in the BUE, BLE  SENSORY:  normal and symmetric to light touch, pinprick, temperature, vibration  COORDINATION:  finger-nose-finger, fine finger movements normal  REFLEXES:  deep tendon reflexes TRACE and symmetric  GAIT/STATION:  narrow based gait     DIAGNOSTIC DATA (LABS, IMAGING, TESTING) - I reviewed patient records, labs, notes, testing and imaging myself where available.  Lab Results  Component Value Date   WBC 10.4 11/28/2021   HGB 13.9 11/28/2021   HCT 40.4 11/28/2021   MCV 91 11/28/2021   PLT 104 (L) 11/28/2021      Component Value Date/Time   NA 141 12/28/2021 1424   K 3.7 12/28/2021 1424   CL 103 12/28/2021 1424   CO2 26 12/28/2021 1424   GLUCOSE 112 (H) 12/28/2021 1424   GLUCOSE 98 08/30/2020 1109   BUN 8 12/28/2021 1424   CREATININE 0.46 (L) 12/28/2021 1424   CREATININE 0.67 08/30/2020 1109   CALCIUM 9.0 12/28/2021 1424   PROT 7.3 12/28/2021 1424   ALBUMIN 4.3 12/28/2021 1424   AST 52 (H) 12/28/2021 1424   ALT 25 12/28/2021 1424   ALT 33 (H) 10/30/2019 1421   ALKPHOS 85 12/28/2021 1424   BILITOT 0.6 12/28/2021 1424   GFRNONAA 100 10/30/2019 1421   GFRAA 116 10/30/2019 1421   No results found for: "CHOL", "HDL", "LDLCALC", "LDLDIRECT", "TRIG", "CHOLHDL" Lab Results  Component Value Date   HGBA1C 4.9 06/02/2020   Lab Results  Component Value Date    VITAMINB12 453 06/02/2020   Lab Results  Component Value Date   TSH 0.510 06/02/2020       ASSESSMENT AND PLAN  57 y.o. year old female here with:   Dx:  1. Neuropathy     PLAN:  NUMBNESS / PAIN (feet / legs) - neuro exam normal except for decreased reflexes at ankles - could be underlying peripheral neuropathy; lab workup so far is negative (except for history of hepatitis C which can be associated with neuropathy) - check additional neuropathy labs - continue supportive care Painful neuropathy treatment options: -duloxetine 30-60mg  daily, amitriptyline 25-50mg  at bedtime, gabapentin 600mg  three times a day, pregabalin 75-150mg  twice a day -capsaicin cream, lidocaine patch / cream, alpha-lipoic acid 600mg  daily  Orders Placed This Encounter  Procedures   ANA,IFA RA Diag Pnl w/rflx Tit/Patn   Multiple Myeloma Panel (SPEP&IFE w/QIG)   ANCA Profile   Return for pending if symptoms worsen or fail to improve, pending test results.    Suanne Marker, MD 05/24/2022, 12:23 PM Certified in Neurology, Neurophysiology and Neuroimaging  [facility] [address] [place], [place] [address] [phone]

## 2022-05-24 NOTE — Patient Instructions (Signed)
  NUMBNESS / PAIN (feet / legs) - neuro exam normal except for decreased reflexes at ankles - could be underlying peripheral neuropathy; lab workup so far is negative - check additional neuropathy labs - continue supportive care Painful neuropathy treatment options: -duloxetine 30-60mg  daily, amitriptyline 25-50mg  at bedtime, gabapentin 600mg  three times a day, pregabalin 75-150mg  twice a day -capsaicin cream, lidocaine patch / cream, alpha-lipoic acid 600mg  daily

## 2022-06-08 ENCOUNTER — Other Ambulatory Visit (INDEPENDENT_AMBULATORY_CARE_PROVIDER_SITE_OTHER): Payer: Self-pay

## 2022-06-08 DIAGNOSIS — Z0289 Encounter for other administrative examinations: Secondary | ICD-10-CM

## 2022-06-09 LAB — MULTIPLE MYELOMA PANEL, SERUM

## 2022-06-09 LAB — ANCA PROFILE

## 2022-06-10 LAB — MULTIPLE MYELOMA PANEL, SERUM

## 2022-06-10 LAB — ANCA PROFILE: Anti-MPO Antibodies: <0.2 units (ref 0.0–0.9)

## 2022-06-16 LAB — MULTIPLE MYELOMA PANEL, SERUM
IgA/Immunoglobulin A, Serum: 277 mg/dL (ref 87–352)
IgM (Immunoglobulin M), Srm: 66 mg/dL (ref 26–217)

## 2022-06-16 LAB — ANA,IFA RA DIAG PNL W/RFLX TIT/PATN
ANA Titer 1: NEGATIVE
Cyclic Citrullin Peptide Ab: 8 units (ref 0–19)
Rheumatoid fact SerPl-aCnc: 13.4 IU/mL (ref <14.0)

## 2022-06-16 LAB — ANCA PROFILE
Atypical pANCA: <1:20 {titer}
C-ANCA: <1:20 {titer}
P-ANCA: <1:20 {titer}

## 2022-06-23 ENCOUNTER — Other Ambulatory Visit: Payer: Self-pay | Admitting: Nurse Practitioner

## 2022-06-23 DIAGNOSIS — K8020 Calculus of gallbladder without cholecystitis without obstruction: Secondary | ICD-10-CM

## 2022-06-23 DIAGNOSIS — K7469 Other cirrhosis of liver: Secondary | ICD-10-CM

## 2022-07-11 ENCOUNTER — Telehealth: Payer: Self-pay | Admitting: Diagnostic Neuroimaging

## 2022-07-11 NOTE — Telephone Encounter (Signed)
Pt is asking for a call with results to lab work

## 2022-07-11 NOTE — Telephone Encounter (Signed)
Called the pt and there was no answer. LVM advising that Dr Marjory Lies had sent the lab results through mychart. The labs came back normal. There was nothing that appeared concerning.   **If pt returns call please reiterate that the lab results were normal.

## 2022-07-11 NOTE — Telephone Encounter (Signed)
Message has been relayed to pt.

## 2022-07-27 ENCOUNTER — Other Ambulatory Visit: Payer: Commercial Managed Care - HMO

## 2022-08-10 ENCOUNTER — Other Ambulatory Visit: Payer: Commercial Managed Care - HMO
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
## 2022-08-22 ENCOUNTER — Ambulatory Visit
Admission: RE | Admit: 2022-08-22 | Discharge: 2022-08-22 | Disposition: A | Discharge status: Home / Self Care | Payer: Commercial Managed Care - HMO | Source: Ambulatory Visit | Attending: Nurse Practitioner | Admitting: Nurse Practitioner

## 2022-08-22 DIAGNOSIS — K8020 Calculus of gallbladder without cholecystitis without obstruction: Secondary | ICD-10-CM

## 2022-08-22 DIAGNOSIS — K7469 Other cirrhosis of liver: Secondary | ICD-10-CM

## 2022-09-02 ENCOUNTER — Other Ambulatory Visit: Payer: Self-pay | Admitting: Internal Medicine

## 2022-10-18 ENCOUNTER — Other Ambulatory Visit: Payer: Self-pay

## 2022-10-18 DIAGNOSIS — G629 Polyneuropathy, unspecified: Secondary | ICD-10-CM

## 2022-10-18 MED ORDER — GABAPENTIN 400 MG PO CAPS: 400.0000 mg | ORAL_CAPSULE | Freq: Three times a day (TID) | ORAL | 5 refills | Status: DC

## 2022-10-29 ENCOUNTER — Emergency Department (HOSPITAL_COMMUNITY): Payer: Commercial Managed Care - HMO

## 2022-10-29 ENCOUNTER — Other Ambulatory Visit: Payer: Self-pay

## 2022-10-29 ENCOUNTER — Emergency Department (HOSPITAL_COMMUNITY)
Admission: EM | Admit: 2022-10-29 | Discharge: 2022-10-29 | Disposition: A | Discharge status: Home / Self Care | Payer: Commercial Managed Care - HMO | Attending: Emergency Medicine | Admitting: Emergency Medicine

## 2022-10-29 ENCOUNTER — Encounter (HOSPITAL_COMMUNITY): Payer: Self-pay

## 2022-10-29 DIAGNOSIS — I10 Essential (primary) hypertension: Secondary | ICD-10-CM | POA: Diagnosis not present

## 2022-10-29 DIAGNOSIS — S301XXA Contusion of abdominal wall, initial encounter: Secondary | ICD-10-CM | POA: Diagnosis not present

## 2022-10-29 DIAGNOSIS — W19XXXA Unspecified fall, initial encounter: Secondary | ICD-10-CM

## 2022-10-29 DIAGNOSIS — R0781 Pleurodynia: Secondary | ICD-10-CM | POA: Insufficient documentation

## 2022-10-29 DIAGNOSIS — S3013XA Contusion of flank (latus) region, initial encounter: Secondary | ICD-10-CM

## 2022-10-29 DIAGNOSIS — S3991XA Unspecified injury of abdomen, initial encounter: Secondary | ICD-10-CM | POA: Diagnosis present

## 2022-10-29 DIAGNOSIS — Y92019 Unspecified place in single-family (private) house as the place of occurrence of the external cause: Secondary | ICD-10-CM | POA: Diagnosis not present

## 2022-10-29 MED ORDER — IBUPROFEN 800 MG PO TABS
800.0000 mg | ORAL_TABLET | Freq: Once | ORAL | Status: AC
  Administered 2022-10-29: 800 mg via ORAL
  Filled 2022-10-29: qty 1

## 2022-10-29 MED ORDER — ACETAMINOPHEN 500 MG PO TABS
1000.0000 mg | ORAL_TABLET | Freq: Once | ORAL | Status: AC
  Administered 2022-10-29: 1000 mg via ORAL
  Filled 2022-10-29: qty 2

## 2022-10-29 NOTE — ED Provider Notes (Signed)
[place] EMERGENCY DEPARTMENT AT [facility] Provider Note   CSN: [id number] Arrival date & time: 10/29/22  9518     History  Chief Complaint  Patient presents with   Fall   HPI Courtney Valdez is a 57 y.o. female with hypertension, hepatic cirrhosis and alcohol use presenting for follow-up.  Occurred Friday.  States she was at home she took a step backwards and fell landing on her left side.  Now complaining of left lower rib and flank pain.  Also endorsing a small bruise in her left flank.  Denies chest pain shortness of breath.  Denies urinary changes.  States the pain has been persistent and bothersome which is why she came here today for evaluation.  Has not taken any medicine for her pain at home.    Fall       Home Medications Prior to Admission medications   Medication Sig Start Date End Date Taking? Authorizing Provider  amLODipine (NORVASC) 10 MG tablet Take 1 tablet (10 mg total) by mouth daily. 05/03/22   Atway, Rayann N, DO  chlorthalidone (HYGROTON) 25 MG tablet TAKE 1 TABLET (25 MG TOTAL) BY MOUTH DAILY. 09/04/22   Atway, Rayann N, DO  Cyanocobalamin (B-12 PO) Take 1 tablet by mouth daily.     [provider]  cyclobenzaprine (FLEXERIL) 5 MG tablet Take 1 tablet by mouth 3 times daily as needed for muscle spasm. Warning: May cause drowsiness. 04/04/22   Atway, Rayann N, DO  Ferrous Sulfate (IRON) 90 (18 Fe) MG TABS Take by mouth daily.    [provider]  gabapentin (NEURONTIN) 400 MG capsule Take 1 capsule (400 mg total) by mouth 3 (three) times daily. 10/18/22   Atway, Rayann N, DO  ibuprofen (ADVIL,MOTRIN) 200 MG tablet Take 400 mg by mouth as needed. For pain     [provider]  losartan (COZAAR) 100 MG tablet Take 0.5 tablets (50 mg total) by mouth daily. 12/28/21 12/28/22  Marolyn Haller, MD      Allergies    Patient has no known allergies.    Review of Systems   Review of Systems  Physical Exam Updated Vital  Signs BP (!) 175/79 (BP Location: Right Arm)   Pulse 79   Temp 98.3 F (36.8 C) (Oral)   Resp 18   SpO2 99%  Physical Exam Vitals and nursing note reviewed.  Constitutional:      Appearance: Normal appearance.  HENT:     Head: Normocephalic and atraumatic.     Nose: Nose normal.     Mouth/Throat:     Mouth: Mucous membranes are moist.  Eyes:     General:        Right eye: No discharge.        Left eye: No discharge.     Conjunctiva/sclera: Conjunctivae normal.  Cardiovascular:     Rate and Rhythm: Normal rate and regular rhythm.     Pulses: Normal pulses.     Heart sounds: Normal heart sounds.  Pulmonary:     Effort: Pulmonary effort is normal.     Breath sounds: Normal breath sounds.  Abdominal:     General: Abdomen is flat.     Palpations: Abdomen is soft.  Skin:    General: Skin is warm and dry.       Neurological:     General: No focal deficit present.     Mental Status: She is alert.  Psychiatric:        Mood  and Affect: Mood normal.     ED Results / Procedures / Treatments   Labs (all labs ordered are listed, but only abnormal results are displayed) Labs Reviewed - No data to display  EKG None  Radiology DG Ribs Unilateral W/Chest Left  Result Date: 10/29/2022 CLINICAL DATA:  Fall 2 days ago.  Left posterior chest wall pain. EXAM: LEFT RIBS AND CHEST - 3+ VIEW COMPARISON:  Chest radiograph on 09/30/2020 FINDINGS: No fracture or other bone lesions are seen involving the ribs. There is no evidence of pneumothorax or pleural effusion. Both lungs are clear. Heart size and mediastinal contours are within normal limits. IMPRESSION: Negative. Electronically Signed   By: Danae Orleans M.D.   On: 10/29/2022 14:00    Procedures Procedures    Medications Ordered in ED Medications  acetaminophen (TYLENOL) tablet 1,000 mg (1,000 mg Oral Given 10/29/22 1220)    ED Course/ Medical Decision Making/ A&P                                 Medical Decision  Making Amount and/or Complexity of Data Reviewed Radiology: ordered.  Risk OTC drugs.  57 year old well-appearing female presenting for left-sided rib and flank pain after a fall that occurred 2 days ago.  Exam notable small bruise about the left superior flank and left lateral subcostal margin.  DDx includes rib fracture, pneumothorax, retroperitoneal bleed, renal contusion, other.  X-ray was negative making rib fracture or pneumothorax unlikely.  Considered retroperitoneal bleed and renal contusion but unlikely given no urinary symptoms or abdominal symptoms and no evidence of symptomatic anemia or hemorrhage.  Overall appears clinically well, no acute distress and hemodynamically stable.  Suspect this is a small contusion she sustained during the fall.  Advised conservative treatment at home.  Treated pain with Tylenol.  Advised to follow-up PCP.  Discussed return precautions.  Vital stable.  Discharged home in good condition.         Final Clinical Impression(s) / ED Diagnoses Final diagnoses:  Fall, initial encounter  Contusion of flank, initial encounter    Rx / DC Orders ED Discharge Orders     None         Gareth Eagle, PA-C 10/29/22 1413    Elayne Snare K, DO 10/29/22 1537

## 2022-10-29 NOTE — ED Triage Notes (Signed)
Pt arrived POV from home stating she had a fall and fell onto her left side and is c/o left rib pain. Pt states she did not hit her head, is not on a blood thinner and did not have LOC.

## 2022-10-29 NOTE — Discharge Instructions (Signed)
Evaluation today was overall reassuring.  X-ray was negative for rib fracture.  Appears that you have a bruise in that area on your left flank.  This is likely causing her pain.  Treatment is conservative at this time.  Recommend icing 3-4 times a day until pain and swelling improves.  You can also take Tylenol ibuprofen.  Also recommend that you follow-up with your PCP.  If you start develop chest pain shortness of breath, abdominal pain, nausea vomiting, blood in the urine or painful urination or any other concerning symptom please return emerged part further evaluation.

## 2022-11-22 ENCOUNTER — Other Ambulatory Visit: Payer: Self-pay | Admitting: Internal Medicine

## 2022-11-25 NOTE — Telephone Encounter (Signed)
Has a different iron rx: takes Ferrous Sulfate (IRON) 90 (18 Fe) MG TABS

## 2022-11-27 ENCOUNTER — Encounter: Payer: Commercial Managed Care - HMO | Admitting: Internal Medicine

## 2022-11-27 NOTE — Progress Notes (Deleted)
CC: ***  HPI:  [name] is a 57 y.o. female living with a history stated below and presents today for ***. Please see problem based assessment and plan for additional details.  Past Medical History:  Diagnosis Date   Chronic hepatitis C without hepatic coma (HCC) 10/31/2019   Labs today   Fu with Cassie after labs are back    Cirrhosis of liver without ascites (HCC) 06/02/2020   Hypertension     Current Outpatient Medications on File Prior to Visit  Medication Sig Dispense Refill   amLODipine (NORVASC) 10 MG tablet Take 1 tablet (10 mg total) by mouth daily. 90 tablet 3   chlorthalidone (HYGROTON) 25 MG tablet TAKE 1 TABLET (25 MG TOTAL) BY MOUTH DAILY. 30 tablet 0   Cyanocobalamin (B-12 PO) Take 1 tablet by mouth daily.      cyclobenzaprine (FLEXERIL) 5 MG tablet Take 1 tablet by mouth 3 times daily as needed for muscle spasm. Warning: May cause drowsiness. 21 tablet 0   Ferrous Sulfate (IRON) 90 (18 Fe) MG TABS Take by mouth daily.     gabapentin (NEURONTIN) 400 MG capsule Take 1 capsule (400 mg total) by mouth 3 (three) times daily. 90 capsule 5   ibuprofen (ADVIL,MOTRIN) 200 MG tablet Take 400 mg by mouth as needed. For pain      losartan (COZAAR) 100 MG tablet Take 0.5 tablets (50 mg total) by mouth daily. 30 tablet 5   No current facility-administered medications on file prior to visit.    Family History  Problem Relation Age of Onset   Colon cancer Father 69   Breast cancer Mother    Breast cancer Maternal Aunt    Colon polyps Neg Hx    Esophageal cancer Neg Hx    Rectal cancer Neg Hx    Stomach cancer Neg Hx     Social History   Socioeconomic History   Marital status: Single    Spouse name: Not on file   Number of children: Not on file   Years of education: Not on file   Highest education level: Not on file  Occupational History   Not on file  Tobacco Use   Smoking status: Some Days    Types: Cigarettes   Smokeless tobacco: Never   Tobacco  comments:    Might take a "puff" here an there  Vaping Use   Vaping status: Never Used  Substance and Sexual Activity   Alcohol use: Yes    Comment: Beer   Drug use: Not Currently   Sexual activity: Yes    Partners: Male    Birth control/protection: None, Rhythm  Other Topics Concern   Not on file  Social History Narrative   Not on file   Social Determinants of Health   Financial Resource Strain: Not on file  Food Insecurity: Low Risk  (06/23/2022)   [facility]   Hunger Vital Sign    Worried About Running Out of Food in the Last Year: Never true    Ran Out of Food in the Last Year: Never true  Transportation Needs: Unmet Transportation Needs (06/23/2022)   [facility]   Transportation    In the past 12 months, has lack of reliable transportation kept you from medical appointments, meetings, work or from getting things needed for daily living? 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]    Frequency of Communication with Friends and Family: More than three times a week    Frequency of Social Gatherings with Friends and Family: More than three times a week    Attends Religious Services: More than 4 times per year    Active Member of [employer] or Organizations: No    Attends Banker Meetings: Never    Marital Status: Never married  Intimate Partner Violence: Not At Risk (12/28/2021)   Humiliation, Afraid, Rape, and Kick questionnaire    Fear of Current or Ex-Partner: No    Emotionally Abused: No    Physically Abused: No    Sexually Abused: No    Review of Systems: ROS negative except for what is noted on the assessment and plan.  There were no vitals filed for this visit.  Physical Exam: Constitutional: well-appearing *** sitting in ***, in no acute distress HENT: normocephalic  atraumatic, mucous membranes moist Eyes: conjunctiva non-erythematous Cardiovascular: regular rate and rhythm, no m/r/g Pulmonary/Chest: normal work of breathing on room air, lungs clear to auscultation bilaterally Abdominal: soft, non-tender, non-distended MSK: normal bulk and tone Neurological: alert & oriented x 3, no focal deficit Skin: warm and dry Psych: normal mood and behavior  Assessment & Plan:   HTN: - losartan 100 - amlodipine 10  - chlorthalidone 25   Back pain: - flexeril  - heating pad   Patient {GC/GE:[id number]::"discussed with","seen with"} Dr. {[id number]::"[id number]","N. Hoffman","Mullen","Narendra","Vincent","Guilloud","Lau","Machen"}  No problem-specific Assessment & Plan notes found for this encounter.   Elza Rafter, D.O. [facility] Internal Medicine, PGY-3 Phone: [phone] Date 11/27/2022 Time 7:37 AM

## 2022-11-30 ENCOUNTER — Other Ambulatory Visit: Payer: Self-pay | Admitting: Internal Medicine

## 2022-11-30 DIAGNOSIS — Z1231 Encounter for screening mammogram for malignant neoplasm of breast: Secondary | ICD-10-CM

## 2022-11-30 NOTE — Progress Notes (Signed)
Order for screening mammogram

## 2022-12-18 ENCOUNTER — Encounter: Payer: Commercial Managed Care - HMO | Admitting: Internal Medicine

## 2022-12-19 ENCOUNTER — Other Ambulatory Visit: Payer: Self-pay | Admitting: Internal Medicine

## 2022-12-19 DIAGNOSIS — I10 Essential (primary) hypertension: Secondary | ICD-10-CM

## 2022-12-20 MED ORDER — LOSARTAN POTASSIUM 50 MG PO TABS: 50.0000 mg | ORAL_TABLET | Freq: Every day | ORAL | 3 refills | Status: DC

## 2022-12-20 NOTE — Addendum Note (Signed)
Addended by: Derrell Lolling on: 12/20/2022 09:37 AM   Modules accepted: Orders

## 2023-01-01 ENCOUNTER — Encounter: Payer: Commercial Managed Care - HMO | Admitting: Internal Medicine

## 2023-01-18 ENCOUNTER — Ambulatory Visit: Payer: Commercial Managed Care - HMO | Admitting: Student

## 2023-01-18 VITALS — BP 146/83 | HR 92 | Temp 98.2°F | Ht 65.0 in | Wt 135.2 lb

## 2023-01-18 DIAGNOSIS — G629 Polyneuropathy, unspecified: Secondary | ICD-10-CM | POA: Diagnosis not present

## 2023-01-18 DIAGNOSIS — D509 Iron deficiency anemia, unspecified: Secondary | ICD-10-CM

## 2023-01-18 DIAGNOSIS — Z124 Encounter for screening for malignant neoplasm of cervix: Secondary | ICD-10-CM

## 2023-01-18 DIAGNOSIS — I10 Essential (primary) hypertension: Secondary | ICD-10-CM | POA: Diagnosis not present

## 2023-01-18 DIAGNOSIS — M545 Low back pain, unspecified: Secondary | ICD-10-CM

## 2023-01-18 DIAGNOSIS — Z1231 Encounter for screening mammogram for malignant neoplasm of breast: Secondary | ICD-10-CM

## 2023-01-18 DIAGNOSIS — G5793 Unspecified mononeuropathy of bilateral lower limbs: Secondary | ICD-10-CM

## 2023-01-18 DIAGNOSIS — K7460 Unspecified cirrhosis of liver: Secondary | ICD-10-CM | POA: Diagnosis not present

## 2023-01-18 DIAGNOSIS — G8929 Other chronic pain: Secondary | ICD-10-CM

## 2023-01-18 MED ORDER — FERROUS SULFATE 325 (65 FE) MG PO TABS: 325.0000 mg | ORAL_TABLET | Freq: Every day | ORAL | 3 refills | Status: DC

## 2023-01-18 MED ORDER — LOSARTAN POTASSIUM 50 MG PO TABS: 50.0000 mg | ORAL_TABLET | Freq: Every day | ORAL | 2 refills | Status: DC

## 2023-01-18 MED ORDER — CHLORTHALIDONE 25 MG PO TABS: 25.0000 mg | ORAL_TABLET | Freq: Every day | ORAL | 2 refills | Status: DC

## 2023-01-18 MED ORDER — AMLODIPINE BESYLATE 10 MG PO TABS: 10.0000 mg | ORAL_TABLET | Freq: Every day | ORAL | 2 refills | Status: DC

## 2023-01-18 MED ORDER — CYCLOBENZAPRINE HCL 5 MG PO TABS: ORAL_TABLET | ORAL | 0 refills | Status: DC

## 2023-01-18 MED ORDER — GABAPENTIN 400 MG PO CAPS: 400.0000 mg | ORAL_CAPSULE | Freq: Three times a day (TID) | ORAL | 5 refills | Status: DC

## 2023-01-18 NOTE — Patient Instructions (Signed)
 Thank you, Courtney Valdez for allowing us  to provide your care today. Today we discussed:  For your blood pressure - Continue taking losartan  100 mg daily, amlodipine  10 mg daily, chlorthalidone  25 mg daily  For your leg numbness and tingling - Continue taking Gabapentin  400 mg 3 times daily  For your back pain -Take Flexeril  5 mg at night, it can cause you to feel drowsy  Please make sure you follow-up with your liver doctor on May 01, 2023.  For the next office visit, please come back for a Pap smear  I sent a referral for mammogram.  All of the refills are sent to your pharmacy.  I will check some blood work today and we will call you for update.  I have ordered the following labs for you:   Lab Orders         CMP14 + Anion Gap         CBC no Diff         Iron, TIBC and Ferritin Panel      Tests ordered today:  As above  Referrals ordered today:   Referral Orders  No referral(s) requested today     I have ordered the following medication/changed the following medications:   Stop the following medications: Medications Discontinued During This Encounter  Medication Reason   Ferrous Sulfate  (IRON) 90 (18 Fe) MG TABS    cyclobenzaprine  (FLEXERIL ) 5 MG tablet Reorder   chlorthalidone  (HYGROTON ) 25 MG tablet Reorder   gabapentin  (NEURONTIN ) 400 MG capsule Reorder   amLODipine  (NORVASC ) 10 MG tablet Reorder   losartan  (COZAAR ) 50 MG tablet Reorder     Start the following medications: Meds ordered this encounter  Medications   amLODipine  (NORVASC ) 10 MG tablet    Sig: Take 1 tablet (10 mg total) by mouth daily.    Dispense:  90 tablet    Refill:  2   chlorthalidone  (HYGROTON ) 25 MG tablet    Sig: Take 1 tablet (25 mg total) by mouth daily.    Dispense:  90 tablet    Refill:  2   losartan  (COZAAR ) 50 MG tablet    Sig: Take 1 tablet (50 mg total) by mouth daily.    Dispense:  90 tablet    Refill:  2   gabapentin  (NEURONTIN ) 400 MG capsule    Sig: Take 1  capsule (400 mg total) by mouth 3 (three) times daily.    Dispense:  90 capsule    Refill:  5   cyclobenzaprine  (FLEXERIL ) 5 MG tablet    Sig: Take 1 tablet by mouth 3 times daily as needed for muscle spasm. Warning: May cause drowsiness.    Dispense:  21 tablet    Refill:  0   ferrous sulfate  325 (65 FE) MG tablet    Sig: Take 1 tablet (325 mg total) by mouth daily.    Dispense:  90 tablet    Refill:  3     Follow up:  for Pap Smear      Remember:   Should you have any questions or concerns please call the internal medicine clinic at [phone].     Toma Edwards, DO [facility]

## 2023-01-18 NOTE — Progress Notes (Signed)
 Established Patient Office Visit  Subjective   Patient ID: Courtney Valdez, female    DOB: 1965-06-11  Age: 58 y.o. MRN: [id number]  Chief Complaint  Patient presents with   Medical Management of Chronic Issues   Medication Refill    HPI This is a 58 year old female living with a history stated below and presents today for routine follow up. Please see problem based assessment and plan for additional details.   Patient Active Problem List   Diagnosis Date Noted   IDA (iron deficiency anemia) 01/19/2023   Screening for cervical cancer 01/19/2023   Screening for breast cancer 01/19/2023   Back pain 04/04/2022   Immunization counseling 12/06/2021   Hepatic cirrhosis (HCC) 12/06/2021   Thrombocytopenia (HCC) 08/31/2020   Uncontrolled hypertension 06/02/2020   Lower extremity neuropathy 06/02/2020   Alcohol use 04/20/2020   Healthcare maintenance 10/31/2019   Past Medical History:  Diagnosis Date   Chronic hepatitis C without hepatic coma (HCC) 10/31/2019   Labs today   Fu with Cassie after labs are back    Cirrhosis of liver without ascites (HCC) 06/02/2020   Hypertension    Past Surgical History:  Procedure Laterality Date   NO PAST SURGERIES     as of 08/19/19   Social History   Tobacco Use   Smoking status: Some Days    Types: Cigarettes   Smokeless tobacco: Never   Tobacco comments:    Might take a puff here an there  Vaping Use   Vaping status: Never Used  Substance Use Topics   Alcohol use: Yes    Comment: Beer   Drug use: Not Currently   Social History   Socioeconomic History   Marital status: Single    Spouse name: Not on file   Number of children: Not on file   Years of education: Not on file   Highest education level: Not on file  Occupational History   Not on file  Tobacco Use   Smoking status: Some Days    Types: Cigarettes   Smokeless tobacco: Never   Tobacco comments:    Might take a puff here an there  Vaping Use   Vaping status:  Never Used  Substance and Sexual Activity   Alcohol use: Yes    Comment: Beer   Drug use: Not Currently   Sexual activity: Yes    Partners: Male    Birth control/protection: None, Rhythm  Other Topics Concern   Not on file  Social History Narrative   Not on file   Social Drivers of Health   Financial Resource Strain: Not on file  Food Insecurity: Low Risk  (06/23/2022)   [facility]   Hunger Vital Sign    Worried About Running Out of Food in the Last Year: Never true    Ran Out of Food in the Last Year: Never true  Transportation Needs: Unmet Transportation Needs (06/23/2022)   [facility]   Transportation    In the past 12 months, has lack of reliable transportation kept you from medical appointments, meetings, work or from getting things needed for daily living? : Yes  Physical Activity: Not on file  Stress: Not on file  Social Connections: Moderately Isolated (12/28/2021)   Social Connection and Isolation Panel [NHANES]    Frequency of Communication with Friends and Family: More than three times a week    Frequency of Social Gatherings with Friends and Family: More than  three times a week    Attends Religious Services: More than 4 times per year    Active Member of Clubs or Organizations: No    Attends Banker Meetings: Never    Marital Status: Never married  Intimate Partner Violence: Not At Risk (12/28/2021)   Humiliation, Afraid, Rape, and Kick questionnaire    Fear of Current or Ex-Partner: No    Emotionally Abused: No    Physically Abused: No    Sexually Abused: No   Family Status  Relation Name Status   Father  (Not Specified)   Mother  Alive   Mat Aunt  Alive   Neg Hx  (Not Specified)  No partnership data on file   Family History  Problem Relation Age of Onset   Colon cancer Father 57   Breast cancer Mother    Breast cancer Maternal Aunt    Colon polyps Neg Hx    Esophageal  cancer Neg Hx    Rectal cancer Neg Hx    Stomach cancer Neg Hx    No Known Allergies  ROS   ROS negative except for what is noted on the assessment and plan.  Objective:     BP (!) 146/83 (BP Location: Left Arm, Patient Position: Sitting, Cuff Size: Normal)   Pulse 92   Temp 98.2 F (36.8 C) (Oral)   Ht 5' 5 (1.651 m)   Wt 135 lb 3.2 oz (61.3 kg)   SpO2 96%   BMI 22.50 kg/m  BP Readings from Last 3 Encounters:  01/18/23 (!) 146/83  10/29/22 (!) 158/72  05/24/22 133/86   Wt Readings from Last 3 Encounters:  01/18/23 135 lb 3.2 oz (61.3 kg)  05/24/22 129 lb (58.5 kg)  04/04/22 130 lb 1.6 oz (59 kg)   SpO2 Readings from Last 3 Encounters:  01/18/23 96%  10/29/22 100%  04/04/22 99%      Physical Exam  General: Sitting in chair, no acute distress Cardio: Regular rate and rhythm, no murmurs, rubs or gallops.  Pulmonary: Clear to ausculation bilaterally with no rales, rhonchi, and crackles  Abdomen: Soft, nontender with normoactive bowel sounds with no rebound or guarding  MSK: 5/5 strength to upper and lower extremities.   Neuro: Alert and oriented x3, no focal deficits   Results for orders placed or performed in visit on 01/18/23  CMP14 + Anion Gap  Result Value Ref Range   Glucose 105 (H) 70 - 99 mg/dL   BUN 8 6 - 24 mg/dL   Creatinine, Ser 9.43 (L) 0.57 - 1.00 mg/dL   eGFR 893 >40 fO/fpw/8.26   BUN/Creatinine Ratio 14 9 - 23   Sodium 142 134 - 144 mmol/L   Potassium 3.0 (L) 3.5 - 5.2 mmol/L   Chloride 101 96 - 106 mmol/L   CO2 22 20 - 29 mmol/L   Anion Gap 19.0 (H) 10.0 - 18.0 mmol/L   Calcium 9.0 8.7 - 10.2 mg/dL   Total Protein 7.0 6.0 - 8.5 g/dL   Albumin 4.1 3.8 - 4.9 g/dL   Globulin, Total 2.9 1.5 - 4.5 g/dL   Bilirubin Total 0.2 0.0 - 1.2 mg/dL   Alkaline Phosphatase 82 44 - 121 IU/L   AST 52 (H) 0 - 40 IU/L   ALT 24 0 - 32 IU/L  CBC no Diff  Result Value Ref Range   WBC 7.9 3.4 - 10.8 x10E3/uL   RBC 4.19 3.77 - 5.28 x10E6/uL   Hemoglobin  13.4 11.1 - 15.9  g/dL   Hematocrit 61.6 65.9 - 46.6 %   MCV 91 79 - 97 fL   MCH 32.0 26.6 - 33.0 pg   MCHC 35.0 31.5 - 35.7 g/dL   RDW 86.9 88.2 - 84.5 %   Platelets 128 (L) 150 - 450 x10E3/uL  Iron, TIBC and Ferritin Panel  Result Value Ref Range   Total Iron Binding Capacity 281 250 - 450 ug/dL   UIBC 50 (L) 868 - 574 ug/dL   Iron 768 (H) 27 - 840 ug/dL   Iron Saturation 82 (HH) 15 - 55 %   Ferritin 270 (H) 15 - 150 ng/mL    Last CBC Lab Results  Component Value Date   WBC 7.9 01/18/2023   HGB 13.4 01/18/2023   HCT 38.3 01/18/2023   MCV 91 01/18/2023   MCH 32.0 01/18/2023   RDW 13.0 01/18/2023   PLT 128 (L) 01/18/2023   Last metabolic panel Lab Results  Component Value Date   GLUCOSE 105 (H) 01/18/2023   NA 142 01/18/2023   K 3.0 (L) 01/18/2023   CL 101 01/18/2023   CO2 22 01/18/2023   BUN 8 01/18/2023   CREATININE 0.56 (L) 01/18/2023   EGFR 106 01/18/2023   CALCIUM 9.0 01/18/2023   PROT 7.0 01/18/2023   ALBUMIN 4.1 01/18/2023   LABGLOB 2.9 01/18/2023   AGRATIO 1.4 12/28/2021   BILITOT 0.2 01/18/2023   ALKPHOS 82 01/18/2023   AST 52 (H) 01/18/2023   ALT 24 01/18/2023   ANIONGAP 12 08/16/2016   Last lipids No results found for: CHOL, HDL, LDLCALC, LDLDIRECT, TRIG, CHOLHDL Last hemoglobin A1c Lab Results  Component Value Date   HGBA1C 4.9 06/02/2020   Last thyroid functions Lab Results  Component Value Date   TSH 0.510 06/02/2020   Last vitamin B12 and Folate Lab Results  Component Value Date   VITAMINB12 453 06/02/2020      The ASCVD Risk score (Arnett DK, et al., 2019) failed to calculate for the following reasons:   Cannot find a previous HDL lab   Cannot find a previous total cholesterol lab    Assessment & Plan:  Patient is discussed with Dr. Lovie  Problem List Items Addressed This Visit       Cardiovascular and Mediastinum   Uncontrolled hypertension (Chronic)   BP Readings from Last 3 Encounters:  01/18/23 (!)  146/83  10/29/22 (!) 158/72  05/24/22 133/86    Patient is current medications are losartan  100 mg daily, amlodipine  10 mg daily, and - chlorthalidone  25 mg daily.  She states that she has an of chlorthalidone  25 mg daily for over a month.  She does have 1+ lower extremity pitting edema bilaterally states it has been present for the past 2 weeks.  Otherwise denies any headaches, chest pain, shortness of breath.  All refills are sent to the pharmacy with 90-day supply.  Plan: Continue taking losartan  100 mg daily, amlodipine  10 mg daily, chlorthalidone  25 mg daily. Continue checking her blood pressures at home      Relevant Medications   amLODipine  (NORVASC ) 10 MG tablet   chlorthalidone  (HYGROTON ) 25 MG tablet   losartan  (COZAAR ) 50 MG tablet     Digestive   Hepatic cirrhosis (HCC)   - Patient follows [facility] in [place]. 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 - FibroTest in October 2021 estimated [facility]  fibrosis and elastography with a liver stiffness of 12.0 kPa. - Most recent RUQ US  showed increased hepatic parenchymal echogenicity suggestive of steatosis. - Next appointment scheduled on 05/01/2023  -Currently denies any abdominal pain, nausea, vomiting.  Patient is vies to continue following up with liver transplant.        Nervous and Auditory   Lower extremity neuropathy   Patient reports chronic bilateral neuropathy, she has seen neurology on 05/24/2022.  Neurology did extensive lab work that consisted ofANA, rheumatoid factor, CCP antibody, multiple myeloma panel, ANCA profile negative.  No true identifiable etiology yet.  Patient states that she takes gabapentin  400 mg 3 times daily, she has been out of this medication.  And her neuropathy is well-controlled on gabapentin .  Plan: Gabapentin  400 mg 3 times daily sent to  pharmacy Continue following up with neurology      Relevant Medications   gabapentin  (NEURONTIN ) 400 MG capsule   cyclobenzaprine  (FLEXERIL ) 5 MG tablet     Other   Back pain   Patient reports chronic back pain, states that it flared up about couple of weeks ago.  States it is localized to left low back, describes it as muscle spasms with limited range of motion in flexion.  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  Does report increasing frequency of muscle spasms and limited range of motion.  It is a nonradiating pain.  No bowel or bladder incontinence.  No recent traumas.  Looking over her chart review, she was previously prescribed a short course of Flexeril  5 mg.  Plan: -Patient is advised to take Tylenol  650 mg every 4-6 hours as needed for pain -She sent a short course of Flexeril  5 mg, patient is advised to take that at night, it can cause her to feel drowsy and especially in combination with gabapentin .      Relevant Medications   cyclobenzaprine  (FLEXERIL ) 5 MG tablet   IDA (iron deficiency anemia)   Per chart review, patient has been taking ferrous sulfate  325 mg for over 10 years, last note is from 2013.  Patient reports that she is not on this medication for several years, she takes it for energy.  States that when she takes these this medication, she has energy to do her job.  Unsure whether she has a clear indication for this medication, per chart review no formal iron panel or studies done.  Her last CBC was within range.  I am a little suspicious about iron overdose.  Will check CBC and iron studies during this office visit to see the need for continuous iron supplementation.  -Check CBC and iron studies      Relevant Orders   CBC no Diff (Completed)   Iron, TIBC and Ferritin Panel (Completed)   Screening for cervical cancer   Patient reports that she does not member her last Pap smear.  She is willing to set up her next  appointment at the next office visit for Pap smear.  -Pap smear at the next office visit      Screening for breast cancer   Referral for mammogram is sent.      Other Visit Diagnoses       Screening mammogram for breast cancer    -  Primary   Relevant Orders   MM 3D SCREENING MAMMOGRAM BILATERAL BREAST     Primary hypertension       Relevant Medications   amLODipine  (NORVASC ) 10 MG  tablet   chlorthalidone  (HYGROTON ) 25 MG tablet   losartan  (COZAAR ) 50 MG tablet   Other Relevant Orders   CMP14 + Anion Gap (Completed)     Peripheral polyneuropathy       Relevant Medications   gabapentin  (NEURONTIN ) 400 MG capsule   cyclobenzaprine  (FLEXERIL ) 5 MG tablet       ADDENDUM 01/19/2023 Lab Results  Component Value Date   IRON 231 (H) 01/18/2023   TIBC 281 01/18/2023   FERRITIN 270 (H) 01/18/2023   - Iron saturation elevated 82, Iron 231. Will discontinue any PO iron supplementation.  - Refer to hematology for phlebotomy or chelation of iron  - Labs are sent to [facility] liver transplant for evaluation for hemachromatosis   Return in about 1 month (around 02/18/2023) for Pap smear .    Toma Edwards, DO

## 2023-01-19 ENCOUNTER — Other Ambulatory Visit: Payer: Self-pay | Admitting: Student

## 2023-01-19 DIAGNOSIS — Z1239 Encounter for other screening for malignant neoplasm of breast: Secondary | ICD-10-CM | POA: Insufficient documentation

## 2023-01-19 DIAGNOSIS — Z124 Encounter for screening for malignant neoplasm of cervix: Secondary | ICD-10-CM | POA: Insufficient documentation

## 2023-01-19 DIAGNOSIS — D509 Iron deficiency anemia, unspecified: Secondary | ICD-10-CM | POA: Insufficient documentation

## 2023-01-19 LAB — CMP14 + ANION GAP
ALT: 24 [IU]/L (ref 0–32)
AST: 52 [IU]/L — ABNORMAL HIGH (ref 0–40)
Albumin: 4.1 g/dL (ref 3.8–4.9)
Alkaline Phosphatase: 82 [IU]/L (ref 44–121)
Anion Gap: 19 mmol/L — ABNORMAL HIGH (ref 10.0–18.0)
BUN/Creatinine Ratio: 14 (ref 9–23)
BUN: 8 mg/dL (ref 6–24)
Bilirubin Total: 0.2 mg/dL (ref 0.0–1.2)
CO2: 22 mmol/L (ref 20–29)
Calcium: 9 mg/dL (ref 8.7–10.2)
Chloride: 101 mmol/L (ref 96–106)
Creatinine, Ser: 0.56 mg/dL — ABNORMAL LOW (ref 0.57–1.00)
Globulin, Total: 2.9 g/dL (ref 1.5–4.5)
Glucose: 105 mg/dL — ABNORMAL HIGH (ref 70–99)
Potassium: 3 mmol/L — ABNORMAL LOW (ref 3.5–5.2)
Sodium: 142 mmol/L (ref 134–144)
Total Protein: 7 g/dL (ref 6.0–8.5)
eGFR: 106 mL/min/{1.73_m2} (ref >59)

## 2023-01-19 LAB — CBC
Hematocrit: 38.3 % (ref 34.0–46.6)
Hemoglobin: 13.4 g/dL (ref 11.1–15.9)
MCH: 32 pg (ref 26.6–33.0)
MCHC: 35 g/dL (ref 31.5–35.7)
MCV: 91 fL (ref 79–97)
Platelets: 128 10*3/uL — ABNORMAL LOW (ref 150–450)
RBC: 4.19 x10E6/uL (ref 3.77–5.28)
RDW: 13 % (ref 11.7–15.4)
WBC: 7.9 10*3/uL (ref 3.4–10.8)

## 2023-01-19 LAB — IRON,TIBC AND FERRITIN PANEL
Ferritin: 270 ng/mL — ABNORMAL HIGH (ref 15–150)
Iron Saturation: 82 % (ref 15–55)
Iron: 231 ug/dL — ABNORMAL HIGH (ref 27–159)
Total Iron Binding Capacity: 281 ug/dL (ref 250–450)
UIBC: 50 ug/dL — ABNORMAL LOW (ref 131–425)

## 2023-01-19 NOTE — Assessment & Plan Note (Addendum)
-   Patient follows [facility] in [place]. 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 - FibroTest in October 2021 estimated F3 fibrosis and elastography with a liver stiffness of 12.0 kPa. - Most recent RUQ US  showed increased hepatic parenchymal echogenicity suggestive of steatosis. - Next appointment scheduled on 05/01/2023  -Currently denies any abdominal pain, nausea, vomiting.  Patient is vies to continue following up with liver transplant.

## 2023-01-19 NOTE — Assessment & Plan Note (Signed)
 Per chart review, patient has been taking ferrous sulfate  325 mg for over 10 years, last note is from 2013.  Patient reports that she is not on this medication for several years, she takes it for energy.  States that when she takes these this medication, she has energy to do her job.  Unsure whether she has a clear indication for this medication, per chart review no formal iron panel or studies done.  Her last CBC was within range.  I am a little suspicious about iron overdose.  Will check CBC and iron studies during this office visit to see the need for continuous iron supplementation.  -Check CBC and iron studies

## 2023-01-19 NOTE — Assessment & Plan Note (Signed)
 Patient reports chronic bilateral neuropathy, she has seen neurology on 05/24/2022.  Neurology did extensive lab work that consisted ofANA, rheumatoid factor, CCP antibody, multiple myeloma panel, ANCA profile negative.  No true identifiable etiology yet.  Patient states that she takes gabapentin  400 mg 3 times daily, she has been out of this medication.  And her neuropathy is well-controlled on gabapentin .  Plan: Gabapentin  400 mg 3 times daily sent to pharmacy Continue following up with neurology

## 2023-01-19 NOTE — Assessment & Plan Note (Signed)
 Patient reports that she does not member her last Pap smear.  She is willing to set up her next appointment at the next office visit for Pap smear.  -Pap smear at the next office visit

## 2023-01-19 NOTE — Assessment & Plan Note (Signed)
 Patient reports chronic back pain, states that it flared up about couple of weeks ago.  States it is localized to left low back, describes it as muscle spasms with limited range of motion in flexion.  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  Does report increasing frequency of muscle spasms and limited range of motion.  It is a nonradiating pain.  No bowel or bladder incontinence.  No recent traumas.  Looking over her chart review, Courtney Valdez was previously prescribed a short course of Flexeril  5 mg.  Plan: -Patient is advised to take Tylenol  650 mg every 4-6 hours as needed for pain -Courtney Valdez sent a short course of Flexeril  5 mg, patient is advised to take that at night, it can cause her to feel drowsy and especially in combination with gabapentin .

## 2023-01-19 NOTE — Assessment & Plan Note (Signed)
 BP Readings from Last 3 Encounters:  01/18/23 (!) 146/83  10/29/22 (!) 158/72  05/24/22 133/86    Patient is current medications are losartan  100 mg daily, amlodipine  10 mg daily, and - chlorthalidone  25 mg daily.  She states that she has an of chlorthalidone  25 mg daily for over a month.  She does have 1+ lower extremity pitting edema bilaterally states it has been present for the past 2 weeks.  Otherwise denies any headaches, chest pain, shortness of breath.  All refills are sent to the pharmacy with 90-day supply.  Plan: Continue taking losartan  100 mg daily, amlodipine  10 mg daily, chlorthalidone  25 mg daily. Continue checking her blood pressures at home

## 2023-01-19 NOTE — Assessment & Plan Note (Signed)
 Referral for mammogram is sent.

## 2023-01-29 NOTE — Progress Notes (Signed)
See attestation from last [facility] note

## 2023-01-29 NOTE — Progress Notes (Signed)
Internal Medicine Clinic Attending  Case discussed with the resident at the time of the visit.  We reviewed the resident's history and exam and pertinent patient test results.  I agree with the assessment, diagnosis, and plan of care documented in the resident's note.    Patient has been taking excessive oral iron supplements, and I am concerned this is leading to hemochromatosis. We counseled her to stop iron immediately. Dr Laney Potash has placed Hematology referral

## 2023-01-30 ENCOUNTER — Telehealth: Payer: Self-pay

## 2023-01-30 NOTE — Telephone Encounter (Signed)
Patient called she is returning a call regarding her lab results.

## 2023-02-02 ENCOUNTER — Telehealth: Payer: Self-pay

## 2023-02-02 NOTE — Telephone Encounter (Signed)
Requesting lab results, please call back.

## 2023-02-05 ENCOUNTER — Encounter: Payer: Self-pay | Admitting: Student

## 2023-02-05 NOTE — Telephone Encounter (Signed)
Patient called she stated she received a phone call about her lab results but she would like for someone to reinterpret the results to her again.

## 2023-02-05 NOTE — Telephone Encounter (Signed)
Called the patient and talked about her iron levels and stating that there is some concern for hemochromatosis.  Patient has been referred to the [facility].  I gave her the address, name, and phone number for her to schedule an appointment as the referral has been authorized.

## 2023-02-24 NOTE — Progress Notes (Deleted)
 [name] CONSULT NOTE  Patient Care Team: Chauncey Mann, DO as PCP - General (Internal Medicine)  ASSESSMENT & PLAN 58 y.o.female with history of hepatitis C, thrombocytopenia referred to Hematology for elevated iron.  Records show chronic mild thrombocytopenia.  Recently results show elevated ferritin and iron saturation. 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 She was also recommend to follow up with [facility] [place] and check AFP with labs every six months. [place] records showed mild thrombocytopenia dated back to 2018 and not worsening as of 01/2023. Discussed testing for hemochromatosis. Avoid iron supplement.    The etiology is not clear at this time and requires further testing and investigation.  Thrombocytopenia (HCC) Chronic with history of cirrhosis and HCV. Records showed thrombocytopenia in the setting of HCV.  Elevated ferritin Both iron sat and ferritin were elevated. Check for hemochromatosis today. Avoid iron supplement.   Orders Placed This Encounter  Procedures   Hemochromatosis DNA, PCR    Standing Status:   Future    Number of Occurrences:   1    Expiration Date:   02/26/2024   Protime-INR    Standing Status:   Future    Number of Occurrences:   1    Expiration Date:   02/26/2024   All questions were answered. The patient knows to call the clinic with any problems, questions or concerns. No barriers to learning was detected. The total time spent in the appointment was {CHL ONC TIME VISIT - ZOXWR:[id number]} encounter with patients including review of chart and various tests results, discussions about plan of care and coordination of care plan  Courtney Sartorius, MD 02/26/2023 4:15 PM  CHIEF COMPLAINTS/PURPOSE OF CONSULTATION:  ***  HISTORY OF PRESENTING ILLNESS:  Courtney Valdez 58 y.o. female is here because of elevated iron.  She has history of HCV  and thrombocytopenia.    MEDICAL HISTORY:  Past Medical History:  Diagnosis Date   Chronic hepatitis C without hepatic coma (HCC) 10/31/2019   Labs today   Fu with Cassie after labs are back    Cirrhosis of liver without ascites (HCC) 06/02/2020   Hypertension     SURGICAL HISTORY: Past Surgical History:  Procedure Laterality Date   NO PAST SURGERIES     as of 08/19/19    SOCIAL HISTORY: Social History   Socioeconomic History   Marital status: Single    Spouse name: Not on file   Number of children: Not on file   Years of education: Not on file   Highest education level: Not on file  Occupational History   Not on file  Tobacco Use   Smoking status: Some Days    Types: Cigarettes   Smokeless tobacco: Never   Tobacco comments:    Might take a "puff" here an there  Vaping Use   Vaping status: Never Used  Substance and Sexual Activity   Alcohol use: Yes    Comment: Beer   Drug use: Not Currently   Sexual activity: Yes    Partners: Male    Birth control/protection: None, Rhythm  Other Topics Concern   Not on file  Social History Narrative   Not on file   Social Drivers of Health   Financial Resource Strain: Not on file  Food Insecurity: Low Risk  (06/23/2022)   [facility]   Hunger Vital Sign    Worried About Running  Out of Food in the Last Year: Never true    Ran Out of Food in the Last Year: Never true  Transportation Needs: Unmet Transportation Needs (06/23/2022)   [facility]   Transportation    In the past 12 months, has lack of reliable transportation kept you from medical appointments, meetings, work or from getting things needed for daily living? : Yes  Physical Activity: Not on file  Stress: Not on file  Social Connections: Moderately Isolated (12/28/2021)   Social Connection and Isolation Panel [NHANES]    Frequency of Communication with Friends and Family: More than three times a week     Frequency of Social Gatherings with Friends and Family: More than three times a week    Attends Religious Services: More than 4 times per year    Active Member of [employer] or Organizations: No    Attends Banker Meetings: Never    Marital Status: Never married  Intimate Partner Violence: Not At Risk (12/28/2021)   Humiliation, Afraid, Rape, and Kick questionnaire    Fear of Current or Ex-Partner: No    Emotionally Abused: No    Physically Abused: No    Sexually Abused: No    FAMILY HISTORY: Family History  Problem Relation Age of Onset   Colon cancer Father 30   Breast cancer Mother    Breast cancer Maternal Aunt    Colon polyps Neg Hx    Esophageal cancer Neg Hx    Rectal cancer Neg Hx    Stomach cancer Neg Hx     ALLERGIES:  has no known allergies.  MEDICATIONS:  Current Outpatient Medications  Medication Sig Dispense Refill   amLODipine (NORVASC) 10 MG tablet Take 1 tablet (10 mg total) by mouth daily. 90 tablet 2   chlorthalidone (HYGROTON) 25 MG tablet Take 1 tablet (25 mg total) by mouth daily. 90 tablet 2   Cyanocobalamin (B-12 PO) Take 1 tablet by mouth daily.      cyclobenzaprine (FLEXERIL) 5 MG tablet Take 1 tablet by mouth 3 times daily as needed for muscle spasm. Warning: May cause drowsiness. 21 tablet 0   gabapentin (NEURONTIN) 400 MG capsule Take 1 capsule (400 mg total) by mouth 3 (three) times daily. 90 capsule 5   ibuprofen (ADVIL,MOTRIN) 200 MG tablet Take 400 mg by mouth as needed. For pain      losartan (COZAAR) 50 MG tablet Take 1 tablet (50 mg total) by mouth daily. 90 tablet 2   No current facility-administered medications for this visit.    REVIEW OF SYSTEMS:   All relevant systems were reviewed with the patient and are negative.  PHYSICAL EXAMINATION: ECOG PERFORMANCE STATUS: {CHL ONC ECOG [phone]}  There were no vitals filed for this visit. There were no vitals filed for this visit.  GENERAL: alert, no distress and  comfortable SKIN: skin color normal and no bruising or petechiae on exposed skin EYES: normal, sclera clear OROPHARYNX: no exudate  NECK: No palpable mass LYMPH:  no palpable cervical, axillary or inguinal lymphadenopathy  LUNGS: clear to auscultation and percussion with normal breathing effort HEART: regular rate & rhythm and no murmurs  ABDOMEN: abdomen soft, non-tender and nondistended. Musculoskeletal: no edema NEURO: no focal motor/sensory deficits  LABORATORY DATA:  I have reviewed the data as listed  RADIOGRAPHIC STUDIES: I have personally reviewed the radiological images as listed and agreed with the findings in the report. No results found.

## 2023-02-26 ENCOUNTER — Inpatient Hospital Stay: Payer: Self-pay | Attending: Internal Medicine

## 2023-02-26 ENCOUNTER — Inpatient Hospital Stay (HOSPITAL_BASED_OUTPATIENT_CLINIC_OR_DEPARTMENT_OTHER): Payer: Self-pay

## 2023-02-26 DIAGNOSIS — R7989 Other specified abnormal findings of blood chemistry: Secondary | ICD-10-CM | POA: Insufficient documentation

## 2023-02-26 DIAGNOSIS — D696 Thrombocytopenia, unspecified: Secondary | ICD-10-CM

## 2023-02-26 LAB — PROTIME-INR
INR: 1.1 (ref 0.8–1.2)
Prothrombin Time: 14.5 s (ref 11.4–15.2)

## 2023-02-26 NOTE — Assessment & Plan Note (Signed)
Both iron sat and ferritin were elevated. Check for hemochromatosis today. Avoid iron supplement.

## 2023-02-26 NOTE — Assessment & Plan Note (Signed)
Chronic with history of cirrhosis and HCV. Records showed thrombocytopenia in the setting of HCV.

## 2023-02-27 ENCOUNTER — Telehealth: Payer: Self-pay

## 2023-03-01 NOTE — Progress Notes (Deleted)
 [facility] CONSULT NOTE  Patient Care Team: Chauncey Mann, DO as PCP - General (Internal Medicine)  ASSESSMENT & PLAN 58 y.o.female with history of hepatitis C, thrombocytopenia referred to Hematology for elevated iron.  Records show chronic mild thrombocytopenia.  Recently results show elevated ferritin and iron saturation. 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 She was also recommend to follow up with [facility] [place] and check AFP with labs every six months. [place] records showed mild thrombocytopenia dated back to 2018 and not worsening as of 01/2023. Discussed testing for hemochromatosis. Avoid iron supplement.    The etiology is not clear at this time and requires further testing and investigation.  No problem-specific Assessment & Plan notes found for this encounter.    No orders of the defined types were placed in this encounter.  All questions were answered. The patient knows to call the clinic with any problems, questions or concerns. No barriers to learning was detected. The total time spent in the appointment was {CHL ONC TIME VISIT - ZOXWR:[id number]} encounter with patients including review of chart and various tests results, discussions about plan of care and coordination of care plan  Melven Sartorius, MD 03/01/2023 9:38 AM  CHIEF COMPLAINTS/PURPOSE OF CONSULTATION:  ***  HISTORY OF PRESENTING ILLNESS:  Courtney Valdez 58 y.o. female is here because of elevated iron.  She has history of HCV and thrombocytopenia.    MEDICAL HISTORY:  Past Medical History:  Diagnosis Date   Chronic hepatitis C without hepatic coma (HCC) 10/31/2019   Labs today   Fu with Cassie after labs are back    Cirrhosis of liver without ascites (HCC) 06/02/2020   Hypertension     SURGICAL HISTORY: Past Surgical History:  Procedure Laterality Date   NO PAST SURGERIES     as of  08/19/19    SOCIAL HISTORY: Social History   Socioeconomic History   Marital status: Single    Spouse name: Not on file   Number of children: Not on file   Years of education: Not on file   Highest education level: Not on file  Occupational History   Not on file  Tobacco Use   Smoking status: Some Days    Types: Cigarettes   Smokeless tobacco: Never   Tobacco comments:    Might take a "puff" here an there  Vaping Use   Vaping status: Never Used  Substance and Sexual Activity   Alcohol use: Yes    Comment: Beer   Drug use: Not Currently   Sexual activity: Yes    Partners: Male    Birth control/protection: None, Rhythm  Other Topics Concern   Not on file  Social History Narrative   Not on file   Social Drivers of Health   Financial Resource Strain: Not on file  Food Insecurity: Low Risk  (06/23/2022)   [facility]   Hunger Vital Sign    Worried About Running Out of Food in the Last Year: Never true    Ran Out of Food in the Last Year: Never true  Transportation Needs: Unmet Transportation Needs (06/23/2022)   [facility]   Transportation    In the past 12 months, has lack of reliable transportation kept you from medical appointments, meetings, work or from getting things needed for daily living? : Yes  Physical Activity: Not on file  Stress: Not on file  Social Connections: Moderately Isolated (12/28/2021)   Social Connection and Isolation Panel [NHANES]    Frequency of Communication with Friends and Family: More than three times a week    Frequency of Social Gatherings with Friends and Family: More than three times a week    Attends Religious Services: More than 4 times per year    Active Member of [employer] or Organizations: No    Attends Banker Meetings: Never    Marital Status: Never married  Intimate Partner Violence: Not At Risk (12/28/2021)   Humiliation, Afraid, Rape, and Kick  questionnaire    Fear of Current or Ex-Partner: No    Emotionally Abused: No    Physically Abused: No    Sexually Abused: No    FAMILY HISTORY: Family History  Problem Relation Age of Onset   Colon cancer Father 35   Breast cancer Mother    Breast cancer Maternal Aunt    Colon polyps Neg Hx    Esophageal cancer Neg Hx    Rectal cancer Neg Hx    Stomach cancer Neg Hx     ALLERGIES:  has no known allergies.  MEDICATIONS:  Current Outpatient Medications  Medication Sig Dispense Refill   amLODipine (NORVASC) 10 MG tablet Take 1 tablet (10 mg total) by mouth daily. 90 tablet 2   chlorthalidone (HYGROTON) 25 MG tablet Take 1 tablet (25 mg total) by mouth daily. 90 tablet 2   Cyanocobalamin (B-12 PO) Take 1 tablet by mouth daily.      cyclobenzaprine (FLEXERIL) 5 MG tablet Take 1 tablet by mouth 3 times daily as needed for muscle spasm. Warning: May cause drowsiness. 21 tablet 0   gabapentin (NEURONTIN) 400 MG capsule Take 1 capsule (400 mg total) by mouth 3 (three) times daily. 90 capsule 5   ibuprofen (ADVIL,MOTRIN) 200 MG tablet Take 400 mg by mouth as needed. For pain      losartan (COZAAR) 50 MG tablet Take 1 tablet (50 mg total) by mouth daily. 90 tablet 2   No current facility-administered medications for this visit.    REVIEW OF SYSTEMS:   All relevant systems were reviewed with the patient and are negative.  PHYSICAL EXAMINATION: ECOG PERFORMANCE STATUS: {CHL ONC ECOG PS:[id number]}  There were no vitals filed for this visit. There were no vitals filed for this visit.  GENERAL: alert, no distress and comfortable SKIN: skin color normal and no bruising or petechiae on exposed skin EYES: normal, sclera clear OROPHARYNX: no exudate  NECK: No palpable mass LYMPH:  no palpable cervical, axillary or inguinal lymphadenopathy  LUNGS: clear to auscultation and percussion with normal breathing effort HEART: regular rate & rhythm and no murmurs  ABDOMEN: abdomen soft,  non-tender and nondistended. Musculoskeletal: no edema NEURO: no focal motor/sensory deficits  LABORATORY DATA:  I have reviewed the data as listed  RADIOGRAPHIC STUDIES: I have personally reviewed the radiological images as listed and agreed with the findings in the report. No results found.

## 2023-03-05 ENCOUNTER — Telehealth: Payer: Self-pay | Admitting: *Deleted

## 2023-03-05 ENCOUNTER — Inpatient Hospital Stay: Payer: Self-pay

## 2023-03-05 NOTE — Telephone Encounter (Signed)
 Called about appt. Mailbox full. We are going to delay X 2 weeks, waiting for more lab results

## 2023-03-06 LAB — HEMOCHROMATOSIS DNA-PCR(C282Y,H63D)

## 2023-03-07 ENCOUNTER — Telehealth: Payer: Self-pay

## 2023-03-07 NOTE — Telephone Encounter (Signed)
 Courtney Valdez

## 2023-03-21 NOTE — Progress Notes (Unsigned)
 [facility] CONSULT NOTE  Patient Care Team: Chauncey Mann, DO as PCP - General (Internal Medicine)  ASSESSMENT & PLAN 58 y.o.female with history of hepatitis C, thrombocytopenia referred to Hematology for elevated iron.  She could not make it for two appointment and rescheduled. Records showed chronic mild thrombocytopenia.  Recently results show elevated ferritin and iron saturation. 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 She was also recommend to follow up with [facility] [place] and check AFP with labs every six months. [place] records showed mild thrombocytopenia dated back to 2018 and not worsening as of 01/2023. Discussed testing for hemochromatosis was negative. Avoid iron supplement with underlying cirrhosis. She also endorsed daily drinking for many years. Recommend alcohol cessation to prevent worsening liver function, secondary hemochromatosis and worsening cytopenia. She expressed understanding.  [facility].   Follow up as needed.  Thrombocytopenia (HCC) Assessment & Plan: Chronic with history of alcohol, cirrhosis and HCV. Records showed thrombocytopenia in the setting of HCV. 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   Alcoholic cirrhosis of liver without ascites (HCC) Assessment & Plan: History of daily alcohol use, HCV Cause of elevated ferritin and low platelet Follow up with GI per their recommendations.     All questions were answered. The patient knows to call the clinic with any problems, questions or concerns. No barriers to learning was detected.  Melven Sartorius, MD 03/22/2023 3:18 PM  CHIEF COMPLAINTS/PURPOSE OF CONSULTATION:  Elevated iron  HISTORY OF PRESENTING ILLNESS:  Courtney Valdez 58 y.o. female is here  because of elevated iron.  She has history of HCV and thrombocytopenia.  She used to drink beer and now cocktails 2 a day. She used to drink beer everyday for about over 30 years.   No other systemic symptoms.  MEDICAL HISTORY:  Past Medical History:  Diagnosis Date   Chronic hepatitis C without hepatic coma (HCC) 10/31/2019   Labs today   Fu with Cassie after labs are back    Cirrhosis of liver without ascites (HCC) 06/02/2020   Hypertension     SURGICAL HISTORY: Past Surgical History:  Procedure Laterality Date   NO PAST SURGERIES     as of 08/19/19    SOCIAL HISTORY: Social History   Socioeconomic History   Marital status: Single    Spouse name: Not on file   Number of children: Not on file   Years of education: Not on file   Highest education level: Not on file  Occupational History   Not on file  Tobacco Use   Smoking status: Some Days    Types: Cigarettes   Smokeless tobacco: Never   Tobacco comments:    Might take a "puff" here an there  Vaping Use   Vaping status: Never Used  Substance and Sexual Activity   Alcohol use: Yes    Comment: Beer   Drug use: Not Currently   Sexual activity: Yes    Partners: Male    Birth control/protection: None, Rhythm  Other Topics Concern   Not on file  Social History Narrative   Not on file   Social Drivers of Health   Financial Resource Strain: Not on file  Food Insecurity: Low Risk  (06/23/2022)   [facility]   Hunger Vital  Sign    Worried About Programme researcher, broadcasting/film/video in the Last Year: Never true    Ran Out of Food in the Last Year: Never true  Transportation Needs: Unmet Transportation Needs (06/23/2022)   [facility]   Transportation    In the past 12 months, has lack of reliable transportation kept you from medical appointments, meetings, work or from getting things needed for daily living? : Yes  Physical Activity: Not on file  Stress: Not on file   Social Connections: Moderately Isolated (12/28/2021)   Social Connection and Isolation Panel [NHANES]    Frequency of Communication with Friends and Family: More than three times a week    Frequency of Social Gatherings with Friends and Family: More than three times a week    Attends Religious Services: More than 4 times per year    Active Member of [employer] or Organizations: No    Attends Banker Meetings: Never    Marital Status: Never married  Intimate Partner Violence: Not At Risk (12/28/2021)   Humiliation, Afraid, Rape, and Kick questionnaire    Fear of Current or Ex-Partner: No    Emotionally Abused: No    Physically Abused: No    Sexually Abused: No    FAMILY HISTORY: Family History  Problem Relation Age of Onset   Colon cancer Father 82   Breast cancer Mother    Breast cancer Maternal Aunt    Colon polyps Neg Hx    Esophageal cancer Neg Hx    Rectal cancer Neg Hx    Stomach cancer Neg Hx     ALLERGIES:  has no known allergies.  MEDICATIONS:  Current Outpatient Medications  Medication Sig Dispense Refill   amLODipine (NORVASC) 10 MG tablet Take 1 tablet (10 mg total) by mouth daily. 90 tablet 2   chlorthalidone (HYGROTON) 25 MG tablet Take 1 tablet (25 mg total) by mouth daily. 90 tablet 2   Cyanocobalamin (B-12 PO) Take 1 tablet by mouth daily.      cyclobenzaprine (FLEXERIL) 5 MG tablet Take 1 tablet by mouth 3 times daily as needed for muscle spasm. Warning: May cause drowsiness. 21 tablet 0   gabapentin (NEURONTIN) 400 MG capsule Take 1 capsule (400 mg total) by mouth 3 (three) times daily. 90 capsule 5   ibuprofen (ADVIL,MOTRIN) 200 MG tablet Take 400 mg by mouth as needed. For pain      losartan (COZAAR) 50 MG tablet Take 1 tablet (50 mg total) by mouth daily. 90 tablet 2   No current facility-administered medications for this visit.    REVIEW OF SYSTEMS:   All relevant systems were reviewed with the patient and are negative.  PHYSICAL  EXAMINATION:  Vitals:   03/22/23 1435  BP: (!) 155/90  Pulse: 90  Resp: 16  Temp: (!) 97.3 F (36.3 C)  SpO2: 98%   Filed Weights   03/22/23 1435  Weight: 136 lb 14.4 oz (62.1 kg)    GENERAL: alert, no distress and comfortable SKIN: skin color normal EYES: normal, sclera clear ABDOMEN: abdomen distended.  LABORATORY DATA:  I have reviewed the data as listed  RADIOGRAPHIC STUDIES: I have personally reviewed the radiological images as listed and agreed with the findings in the report. No results found.

## 2023-03-22 ENCOUNTER — Inpatient Hospital Stay: Payer: Self-pay | Attending: Internal Medicine

## 2023-03-22 VITALS — BP 155/90 | HR 90 | Temp 97.3°F | Resp 16 | Wt 136.9 lb

## 2023-03-22 DIAGNOSIS — K7030 Alcoholic cirrhosis of liver without ascites: Secondary | ICD-10-CM

## 2023-03-22 DIAGNOSIS — Z79899 Other long term (current) drug therapy: Secondary | ICD-10-CM | POA: Diagnosis not present

## 2023-03-22 DIAGNOSIS — D696 Thrombocytopenia, unspecified: Secondary | ICD-10-CM | POA: Diagnosis not present

## 2023-03-22 DIAGNOSIS — B182 Chronic viral hepatitis C: Secondary | ICD-10-CM | POA: Diagnosis not present

## 2023-03-22 DIAGNOSIS — I10 Essential (primary) hypertension: Secondary | ICD-10-CM | POA: Insufficient documentation

## 2023-03-22 DIAGNOSIS — F17210 Nicotine dependence, cigarettes, uncomplicated: Secondary | ICD-10-CM | POA: Diagnosis not present

## 2023-03-22 DIAGNOSIS — R7989 Other specified abnormal findings of blood chemistry: Secondary | ICD-10-CM | POA: Diagnosis not present

## 2023-03-22 NOTE — Assessment & Plan Note (Signed)
 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

## 2023-03-22 NOTE — Assessment & Plan Note (Signed)
 History of daily alcohol use, HCV Cause of elevated ferritin and low platelet Follow up with GI per their recommendations.

## 2023-03-22 NOTE — Assessment & Plan Note (Signed)
 Chronic with history of alcohol, cirrhosis and HCV. Records showed thrombocytopenia in the setting of HCV. Discussed alcohol cessation

## 2023-04-19 ENCOUNTER — Other Ambulatory Visit: Payer: Self-pay | Admitting: Student

## 2023-04-19 ENCOUNTER — Ambulatory Visit: Payer: Self-pay

## 2023-04-19 MED ORDER — CYCLOBENZAPRINE HCL 5 MG PO TABS: ORAL_TABLET | ORAL | 0 refills | Status: DC

## 2023-04-19 MED ORDER — CYCLOBENZAPRINE HCL 5 MG PO TABS: ORAL_TABLET | ORAL | 0 refills | Status: AC

## 2023-04-19 NOTE — Telephone Encounter (Signed)
 Patient called in stating she is having continued muscle spasms of her back and shoulders. Patient states PCP is aware and have diagnosed these, and she is not having any new symptoms. Patient is asking for [facility]  Copied from CRM [id number]. Topic: Clinical - Red Word Triage >> Apr 19, 2023  2:11 PM Courtney Valdez wrote: [facility] that prompted transfer to Nurse Triage: Patient is requesting a muscle relaxer due to severe back pain and muscle spasms ongoing for the past 3-4 days. They've been using Biofreeze for relief, but the pain remains severe, particularly affecting their arm and back. Reason for Disposition  Back pain  Answer Assessment - Initial Assessment Questions 1. ONSET: "When did the pain begin?"      Ongoing, PCP is aware 2. LOCATION: "Where does it hurt?" (upper, mid or lower back)     Back, buttock, shoulder muscle 3. SEVERITY: "How bad is the pain?"  (e.g., Scale 1-10; mild, moderate, or severe)   - MILD (1-3): Doesn't interfere with normal activities.    - MODERATE (4-7): Interferes with normal activities or awakens from sleep.    - SEVERE (8-10): Excruciating pain, unable to do any normal activities.      Ranges 4. PATTERN: "Is the pain constant?" (e.g., yes, no; constant, intermittent)      Comes and goes 5. RADIATION: "Does the pain shoot into your legs or somewhere else?"     Buttock and shoulder blade  6. CAUSE:  "What do you think is causing the back pain?"      Muscle spasms - diagnosed by PCP 7. BACK OVERUSE:  "Any recent lifting of heavy objects, strenuous work or exercise?"     No 8. MEDICINES: "What have you taken so far for the pain?" (e.g., nothing, acetaminophen, NSAIDS)     Flexeril 9. NEUROLOGIC SYMPTOMS: "Do you have any weakness, numbness, or problems with bowel/bladder control?"     No 10. OTHER SYMPTOMS: "Do you have any other symptoms?" (e.g., fever, abdomen pain, burning with urination, blood in urine)        No  Protocols used: Back Pain-A-AH

## 2023-04-23 ENCOUNTER — Other Ambulatory Visit: Payer: Self-pay | Admitting: Internal Medicine

## 2023-04-23 NOTE — Telephone Encounter (Signed)
 Copied from CRM [id number]. Topic: Clinical - Medication Refill >> Apr 23, 2023 12:34 PM Adrianna P wrote: Most Recent Primary Care Visit:  Provider: Lanney Pitts  Department: [facility]  Visit Type: OPEN ESTABLISHED  Date: 01/18/2023  Medication: cyclobenzaprine (FLEXERIL) 5 MG tablet  Has the patient contacted their pharmacy? No (Agent: If no, request that the patient contact the pharmacy for the refill. If patient does not wish to contact the pharmacy document the reason why and proceed with request.) (Agent: If yes, when and what did the pharmacy advise?)  Is this the correct pharmacy for this prescription? Yes If no, delete pharmacy and type the correct one.  This is the patient's preferred pharmacy:  [phone] Jonette Nestle, [place] - [address] [place] [address] Phone: [phone] Fax: [phone]    Has the prescription been filled recently? No  Is the patient out of the medication? Yes  Has the patient been seen for an appointment in the last year OR does the patient have an upcoming appointment? Yes  Can we respond through MyChart? Yes  Agent: Please be advised that Rx refills may take up to 3 business days. We ask that you follow-up with your pharmacy.

## 2023-04-24 NOTE — Telephone Encounter (Signed)
 Cancelled reorder, [facility]. Closing encounter.

## 2023-05-11 ENCOUNTER — Other Ambulatory Visit: Payer: Self-pay | Admitting: Internal Medicine

## 2023-06-15 ENCOUNTER — Other Ambulatory Visit: Payer: Self-pay | Admitting: Internal Medicine

## 2023-06-21 NOTE — Progress Notes (Signed)
 No show

## 2023-08-15 ENCOUNTER — Other Ambulatory Visit: Payer: Self-pay

## 2023-08-15 NOTE — Telephone Encounter (Unsigned)
 Copied from CRM #[id number]. Topic: Clinical - Medication Refill >> Aug 15, 2023 12:58 PM Leah C wrote: Medication: chlorthalidone  (HYGROTON ) 25 MG tablet  Has the patient contacted their pharmacy? No (Agent: If no, request that the patient contact the pharmacy for the refill. If patient does not wish to contact the pharmacy document the reason why and proceed with request.) (Agent: If yes, when and what did the pharmacy advise?)  This is the patient's preferred pharmacy:  [facility] [phone] [address], [place] - [address] [place] [address] Phone: [phone] Fax: [phone]   Is this the correct pharmacy for this prescription? Yes If no, delete pharmacy and type the correct one.   Has the prescription been filled recently? No  Is the patient out of the medication? Yes  Has the patient been seen for an appointment in the last year OR does the patient have an upcoming appointment? Yes  Can we respond through MyChart? Yes  Agent: Please be advised that Rx refills may take up to 3 business days. We ask that you follow-up with your pharmacy.

## 2023-08-17 MED ORDER — CHLORTHALIDONE 25 MG PO TABS
25.0000 mg | ORAL_TABLET | Freq: Every day | ORAL | 2 refills | Status: DC
Start: 2023-08-17 — End: 2023-11-05

## 2023-08-28 ENCOUNTER — Ambulatory Visit: Payer: Self-pay

## 2023-08-28 NOTE — Telephone Encounter (Signed)
 Patient denies higher acuity questions. Advised to call 911 if she experiences shortness of breath or chest pain while en route to the ED if not close.  FYI Only or Action Required?: FYI only for provider.  Patient was last seen in primary care on 01/18/2023 by Heddy Barren, DO.  Called Nurse Triage reporting Triage.  Symptoms began several days ago.  Interventions attempted: Rest, hydration, or home remedies.  Symptoms are: gradually worsening.  Triage Disposition: Go to ED Now (or PCP Triage)  Patient/caregiver understands and will follow disposition?: Yes  Reason for Disposition  Patient sounds very sick or weak to the triager  Answer Assessment - Initial Assessment Questions ONSET: When did the swelling start? (e.g., minutes, hours, days)     4 days  LOCATION: What part of the leg is swollen?  Are both legs swollen or just one leg?     Bilateral - ankle, feet and toes  SEVERITY of WIDESPREAD SWELLING (e.g., Edema): How bad is the swelling? - MILD edema - swelling limited to foot and ankle, pitting edema < 1/4 inch deep, rest and elevation eliminate most or all swelling - MODERATE edema - swelling of lower leg to knee, pitting edema > 1/4 inch deep, rest and elevation only partially reduce swelling - SEVERE edema - swelling extends above knee, facial or hand swelling also present      Severe, skin is shiny and red  Answer Assessment - Initial Assessment Questions Patient reports severe bilateral ankle, feet and toe edema. Reports skin is shiny and red. Has full loss of sensation in both soles and all toes. Triaged to ED, patient agrees to go now.  ONSET: When did the swelling start? (e.g., minutes, hours, days)     4 days  LOCATION: What part of the leg is swollen?  Are both legs swollen or just one leg?     Bilateral - ankle, feet and toes  SEVERITY of WIDESPREAD SWELLING (e.g., Edema): How bad is the swelling? - MILD edema - swelling limited to foot  and ankle, pitting edema < 1/4 inch deep, rest and elevation eliminate most or all swelling - MODERATE edema - swelling of lower leg to knee, pitting edema > 1/4 inch deep, rest and elevation only partially reduce swelling - SEVERE edema - swelling extends above knee, facial or hand swelling also present      Severe, skin is shiny and red  Protocols used: Leg or Foot Swelling-P-AH, Leg Swelling and Edema-A-AH Copied from CRM #[id number]. Topic: Clinical - Red Word Triage >> Aug 28, 2023  3:47 PM Carrielelia G wrote: Red Word that prompted transfer to Nurse Triage:  ankle and feet swollen, pain and numbness , four days  -----------------------------------------------------------------------

## 2023-09-17 ENCOUNTER — Other Ambulatory Visit: Payer: Self-pay | Admitting: Nurse Practitioner

## 2023-09-17 DIAGNOSIS — K7581 Nonalcoholic steatohepatitis (NASH): Secondary | ICD-10-CM

## 2023-09-17 DIAGNOSIS — K7469 Other cirrhosis of liver: Secondary | ICD-10-CM

## 2023-09-19 ENCOUNTER — Ambulatory Visit: Payer: Self-pay

## 2023-09-19 NOTE — Telephone Encounter (Signed)
 FYI Only or Action Required?: FYI only for provider.  Patient was last seen in primary care on 01/18/2023 by Heddy Barren, DO.  Called Nurse Triage reporting Back Pain.  Symptoms began several weeks ago.  Interventions attempted: OTC medications: Biofreeze.  Symptoms are: unchanged.  Triage Disposition: See PCP Within 2 Weeks  Patient/caregiver understands and will follow disposition?: Yes Reason for Disposition  Back pain present > 2 weeks  Answer Assessment - Initial Assessment Questions Patient also requesting something sent into pharmacy on file before appointment if possible.  1. ONSET: When did the pain begin? (e.g., minutes, hours, days)     Week or so  2. LOCATION: Where does it hurt? (upper, mid or lower back)     Across top of buttocks, lower back  3. SEVERITY: How bad is the pain?  (e.g., Scale 1-10; mild, moderate, or severe)     Moderate  4. PATTERN: Is the pain constant? (e.g., yes, no; constant, intermittent)      Constant  5. RADIATION: Does the pain shoot into your legs or somewhere else?     No  6. CAUSE:  What do you think is causing the back pain?      Patient thinks its kidneys  7. BACK OVERUSE:  Any recent lifting of heavy objects, strenuous work or exercise?     No  8. MEDICINES: What have you taken so far for the pain? (e.g., nothing, acetaminophen , NSAIDS)     Biofreeze  9. NEUROLOGIC SYMPTOMS: Do you have any weakness, numbness, or problems with bowel/bladder control?     Feet numb for 2 weeks  10. OTHER SYMPTOMS: Do you have any other symptoms? (e.g., fever, abdomen pain, burning with urination, blood in urine)       No  Protocols used: Back Pain-A-AH  Copied from CRM #[id number]. Topic: Clinical - Red Word Triage >> Sep 19, 2023  2:18 PM Suzette B wrote: Patient states she is currently having severe back pain and is unsu

## 2023-09-20 ENCOUNTER — Ambulatory Visit: Payer: Self-pay

## 2023-09-20 NOTE — Telephone Encounter (Signed)
 Called pt to offer her an appt this afternoon; no answer. Left message on pt's vm of office's return call.

## 2023-09-20 NOTE — Telephone Encounter (Signed)
 Next appt scheduled 09/24/23.

## 2023-09-20 NOTE — Telephone Encounter (Signed)
 Confirmed upcoming office visit 09/24/23. Flexeril  pended.  Answer Assessment - Initial Assessment Questions 1. NAME of MEDICINE: What medicine(s) are you calling about?     Flexeril   2. QUESTION: What is your question? (e.g., double dose of medicine, side effect)     Patient requesting Flexeril  refill, has appt Monday for back pain (triaged yesterday)  Protocols used: Medication Question Call-A-AH Copied from CRM #[id number]. Topic: Clinical - Red Word Triage >> Sep 20, 2023 12:03 PM Zane F wrote: Red Word that prompted transfer to Nurse Triage:   Back spasms

## 2023-09-24 ENCOUNTER — Ambulatory Visit: Payer: Self-pay

## 2023-09-27 ENCOUNTER — Ambulatory Visit

## 2023-09-28 ENCOUNTER — Other Ambulatory Visit

## 2023-10-03 ENCOUNTER — Ambulatory Visit: Payer: Self-pay | Admitting: Student

## 2023-10-03 NOTE — Progress Notes (Deleted)
 CC: ***  HPI: Courtney Valdez is a 58 y.o. female living with a history stated below and presents today for ***. Please see problem based assessment and plan for additional details.  Past Medical History:  Diagnosis Date   Chronic hepatitis C without hepatic coma (HCC) 10/31/2019   Labs today   Fu with Cassie after labs are back    Cirrhosis of liver without ascites (HCC) 06/02/2020   Hypertension     Current Outpatient Medications on File Prior to Visit  Medication Sig Dispense Refill   amLODipine  (NORVASC ) 10 MG tablet Take 1 tablet (10 mg total) by mouth daily. 90 tablet 2   chlorthalidone  (HYGROTON ) 25 MG tablet Take 1 tablet (25 mg total) by mouth daily. 90 tablet 2   Cyanocobalamin (B-12 PO) Take 1 tablet by mouth daily.      cyclobenzaprine  (FLEXERIL ) 5 MG tablet Take 1 tablet by mouth 3 times daily as needed for muscle spasm. Warning: May cause drowsiness. 21 tablet 0   gabapentin  (NEURONTIN ) 400 MG capsule Take 1 capsule (400 mg total) by mouth 3 (three) times daily. 90 capsule 5   ibuprofen  (ADVIL ,MOTRIN ) 200 MG tablet Take 400 mg by mouth as needed. For pain      losartan  (COZAAR ) 100 MG tablet Take 1 tablet (100 mg total) by mouth daily. 90 tablet 1   No current facility-administered medications on file prior to visit.    Family History  Problem Relation Age of Onset   Colon cancer Father 65   Breast cancer Mother    Breast cancer Maternal Aunt    Colon polyps Neg Hx    Esophageal cancer Neg Hx    Rectal cancer Neg Hx    Stomach cancer Neg Hx     Social History   Socioeconomic History   Marital status: Single    Spouse name: Not on file   Number of children: Not on file   Years of education: Not on file   Highest education level: Not on file  Occupational History   Not on file  Tobacco Use   Smoking status: Some Days    Types: Cigarettes   Smokeless tobacco: Never   Tobacco comments:    Might take a puff here an there  Vaping Use   Vaping  status: Never Used  Substance and Sexual Activity   Alcohol use: Yes    Comment: Beer   Drug use: Not Currently   Sexual activity: Yes    Partners: Male    Birth control/protection: None, Rhythm  Other Topics Concern   Not on file  Social History Narrative   Not on file   Social Drivers of Health   Financial Resource Strain: Not on file  Food Insecurity: Low Risk  (06/23/2022)   Received from [facility]   Hunger Vital Sign    Within the past 12 months, you worried that your food would run out before you got money to buy more: Never true    Within the past 12 months, the food you bought just didn't last and you didn't have money to get more. : Never true  Transportation Needs: Unmet Transportation Needs (06/23/2022)   Received from [facility]    In the past 12 months, has lack of reliable transportation kept you from medical appointments, meetings, work or from getting things needed for daily living? : Yes  Physical Activity: Not on file  Stress: Not on file  Social Connections: Moderately Isolated (12/28/2021)  Social Connection and Isolation Panel    Frequency of Communication with Friends and Family: More than three times a week    Frequency of Social Gatherings with Friends and Family: More than three times a week    Attends Religious Services: More than 4 times per year    Active Member of [employer] or Organizations: No    Attends Banker Meetings: Never    Marital Status: Never married  Intimate Partner Violence: Not At Risk (12/28/2021)   Humiliation, Afraid, Rape, and Kick questionnaire    Fear of Current or Ex-Partner: No    Emotionally Abused: No    Physically Abused: No    Sexually Abused: No    Review of Systems: ROS negative except for what is noted on the assessment and plan.  There were no vitals filed for this visit.  Physical Exam  Physical Exam: Constitutional: well-appearing *** sitting in ***, in no acute  distress HENT: normocephalic atraumatic, mucous membranes moist Eyes: conjunctiva non-erythematous Neck: supple Cardiovascular: regular rate and rhythm, no m/r/g Pulmonary/Chest: normal work of breathing on room air, lungs clear to auscultation bilaterally Abdominal: soft, non-tender, non-distended MSK: *** Neurological: alert & oriented x 3, 5/5 strength in bilateral upper and lower extremities, normal gait Skin: warm and dry Psych: ***  Assessment & Plan:   Assessment & Plan   No orders of the defined types were placed in this encounter.  PMH: HTN, hepatic cirrhosis, neuropathy, thrombocytopenia, IDA, back pain  Chronic back pain Was given gabapentin  400 mg 3 times daily along with short course of Flexeril    Status: ***. BP today ***. Reports taking losartan  100 mg, amlodipine  10 mg, chlorthalidone  25 mg, ***, ***.  Last BMP in 01/2023 was normal.  Patient *** symptoms of hypotension at home.  They *** check their blood pressure at home with an average of ***.  Plan -Continue: *** -BMP ***    Cirrhosis - Patient follows [facility] liver care and transplant in [place]. 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 - FibroTest in October 2021 estimated F3 fibrosis and elastography with a liver stiffness of 12.0 kPa. - Most recent RUQ US  showed increased hepatic parenchymal echogenicity suggestive of steatosis.  No follow-ups on file.   Patient {GC/[id number]::discussed with,seen with} Dr. {[id number]::Tpoopjfd,Z. Hoffman,Winfrey,Narendra,Chun,Chambliss,Lau,Machen}  Ozell Nearing, D.O. [facility] Internal Medicine, PGY-3 Phone: [phone] Date 10/03/2023 Time 8:29 AM

## 2023-10-28 ENCOUNTER — Other Ambulatory Visit: Payer: Self-pay | Admitting: Student

## 2023-10-28 DIAGNOSIS — G629 Polyneuropathy, unspecified: Secondary | ICD-10-CM

## 2023-11-02 ENCOUNTER — Ambulatory Visit: Payer: Self-pay

## 2023-11-02 NOTE — Telephone Encounter (Signed)
 FYI Only or Action Required?: FYI only for provider.  Patient was last seen in primary care on 01/18/2023 by Heddy Barren, DO.  Called Nurse Triage reporting Rectal Bleeding.  Symptoms began today.  Interventions attempted: Nothing.  Symptoms are: stable.  Triage Disposition: See PCP When Office is Open (Within 3 Days)  Patient/caregiver understands and will follow disposition?:   **Appt. Scheduled for 10/27**              Copied from CRM #[id number]. Topic: Clinical - Red Word Triage >> Nov 02, 2023 11:51 AM Merlynn LABOR wrote: Red Word that prompted transfer to Nurse Triage: Blood in Stool Reason for Disposition  MILD rectal bleeding (e.g., more than just a few drops or streaks)  Answer Assessment - Initial Assessment Questions 1. APPEARANCE of BLOOD: What color is it? Is it passed separately, on the surface of the stool, or mixed in with the stool?      Hard to tell as it was mixed in the stool   2. AMOUNT: How much blood was passed?      Small amount, mixed in the stool   3. FREQUENCY: How many times has blood been passed with the stools?      3 bloody stools with each BM today, she reports diarrhea x 3 days    4. ONSET: When was the blood first seen in the stools? (Days or weeks)      X 1 day, this morning   5. DIARRHEA: Is there also some diarrhea? If Yes, ask: How many diarrhea stools in the past 24 hours?      X 3   6. CONSTIPATION: Do you have constipation? If Yes, ask: How bad is it?     No   7. RECURRENT SYMPTOMS: Have you had blood in your stools before? If Yes, ask: When was the last time? and What happened that time?      No   8. BLOOD THINNERS: Do you take any blood thinners? (e.g., aspirin, clopidogrel / Plavix, coumadin, heparin). Notes: Other strong blood thinners include: Arixtra (fondaparinux), Eliquis (apixaban), Pradaxa (dabigatran), and Xarelto (rivaroxaban).     No   9. OTHER SYMPTOMS: Do you have any other  symptoms?  (e.g., abdomen pain, vomiting, dizziness, fever)  No.   Patient reports diarrhea x 3 days, this morning she noticed a small amount of blood mixed in each stool today. She has had x 3 BM since this morning. She is unsure of the color as it is mixed in with the stool, no abdominal pain, fever, nausea or vomiting noted. Patient scheduled for 10/27 for evaluation but was advised if the amount of bleeding increases or if anything changes or worsens to seek care in the UC/ED; she agrees with plan of care.  Protocols used: Rectal Bleeding-A-AH

## 2023-11-02 NOTE — Telephone Encounter (Signed)
 Called pt  - stated she noticed blood in her stools and when she wipes; stated blood is not bright red. Pt is aware to go to UC/ED for any changes. She has an appt on Monday 10/27 with Dr Celestina.

## 2023-11-05 ENCOUNTER — Ambulatory Visit (INDEPENDENT_AMBULATORY_CARE_PROVIDER_SITE_OTHER): Payer: Self-pay | Admitting: Student

## 2023-11-05 VITALS — BP 139/76 | HR 91 | Temp 98.1°F | Ht 65.0 in | Wt 133.8 lb

## 2023-11-05 DIAGNOSIS — G629 Polyneuropathy, unspecified: Secondary | ICD-10-CM

## 2023-11-05 DIAGNOSIS — K7460 Unspecified cirrhosis of liver: Secondary | ICD-10-CM

## 2023-11-05 DIAGNOSIS — K625 Hemorrhage of anus and rectum: Secondary | ICD-10-CM | POA: Diagnosis not present

## 2023-11-05 DIAGNOSIS — I10 Essential (primary) hypertension: Secondary | ICD-10-CM

## 2023-11-05 DIAGNOSIS — Z23 Encounter for immunization: Secondary | ICD-10-CM

## 2023-11-05 DIAGNOSIS — Z79899 Other long term (current) drug therapy: Secondary | ICD-10-CM

## 2023-11-05 DIAGNOSIS — F17210 Nicotine dependence, cigarettes, uncomplicated: Secondary | ICD-10-CM

## 2023-11-05 MED ORDER — AMLODIPINE BESYLATE 10 MG PO TABS: 10.0000 mg | ORAL_TABLET | Freq: Every day | ORAL | 2 refills | Status: AC

## 2023-11-05 MED ORDER — GABAPENTIN 400 MG PO CAPS: 400.0000 mg | ORAL_CAPSULE | Freq: Three times a day (TID) | ORAL | 5 refills | Status: AC

## 2023-11-05 MED ORDER — CHLORTHALIDONE 25 MG PO TABS: 25.0000 mg | ORAL_TABLET | Freq: Every day | ORAL | 2 refills | Status: AC

## 2023-11-05 MED ORDER — LOSARTAN POTASSIUM 100 MG PO TABS: 100.0000 mg | ORAL_TABLET | Freq: Every day | ORAL | 1 refills | Status: AC

## 2023-11-05 NOTE — Patient Instructions (Addendum)
 It was a pleasure taking care of you today!    1.  I am glad the rectal bleeding has resolved, I am unable to pinpoint a cause of it.  If you do have a recurrence of it, please let me know, and I will make a referral to GI for colonoscopy.   2.  Sent refills of medicine to the pharmacy.   I have ordered the following labs for you:   Lab Orders         CBC         Basic metabolic panel with GFR       Follow up: 3 months   Should you have any questions or concerns please call the internal medicine clinic at [phone].     Missy Sandhoff, MD  [facility]

## 2023-11-05 NOTE — Progress Notes (Signed)
 CC: Acute visit for rectal bleeding.  HPI:  [name] is a 58 y.o. 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  Patient presents with a recent episode of rectal bleeding accompanied by mild rectal discomfort. Bleeding began on Thursday and resolved by Saturday. She  reports bright red blood, intermittently mixed with stool and noted on toilet paper. No clots were observed. Denies change in stool caliber, diarrhea, or constipation. No recent travel, antibiotic use, or infectious exposures.  He denies abdominal pain, nausea, vomiting, fever, or unintentional weight loss. No dizziness, lightheadedness, or syncope during the episode. Denies use of NSAIDs, aspirin, or anticoagulant medications.  Past medical history is notable for alcohol-related liver cirrhosis. Last colonoscopy (2021) was normal. No prior history of hemorrhoids, anal fissures, or inflammatory bowel disease.  Notably, patient was last evaluated in the [facility] in January 2025 and referred to gastroenterology for elevated iron levels with concern for hemochromatosis. Hemochromatosis testing returned negative. Please see problem based assessment and plan for additional details.  Past Medical History:  Diagnosis Date   Chronic hepatitis C without hepatic coma (HCC) 10/31/2019   Labs today   Fu with Cassie after labs are back    Cirrhosis of liver without ascites (HCC) 06/02/2020   Hypertension     Current Outpatient Medications on File Prior to Visit  Medication Sig Dispense Refill   Cyanocobalamin (B-12 PO) Take 1 tablet by mouth daily.      cyclobenzaprine  (FLEXERIL ) 5 MG tablet Take 1 tablet by mouth 3 times daily as needed for muscle spasm. Warning: May cause drowsiness. 21 tablet 0   No current facility-administered medications on file prior to visit.    Review of Systems: ROS negative except for what is noted on the  assessment and plan.  Vitals:   11/05/23 1404  BP: 139/76  Pulse: 91  Temp: 98.1 F (36.7 C)  TempSrc: Oral  SpO2: 96%  Weight: 133 lb 12.8 oz (60.7 kg)  Height: 5' 5 (1.651 m)      Physical Exam: Constitutional: NAD Cardiovascular: RRR, no murmurs. Pulmonary/Chest: Clear bilateral lungs Abdominal: soft, non-tender, non-distended. GU exam: Perineal area clean and intact, no external hemorrhoids, fissures or fistulas noted.  DRE showed normal anal sphincter tone no palpable internal masses or nodules.  No bleeding noted.  Assessment & Plan:   Patient discussed with Dr. CHARLENA Eastern  Assessment & Plan Rectal bleeding Bright red blood per rectum, now resolved. DRE negative for blood; no anal fissures or hemorrhoids noted. Possible etiologies include diverticulosis, IBD, or angiodysplasia. History of variceal bleeding noted, though that typically presents with melena. Last colonoscopy in 2021 was normal.  Plan: -Check CBC to monitor for anemia. -Monitor for recurrence of bleeding. - Consider GI referral for repeat colonoscopy if bleeding recurs or if anemia develops. - Reinforce avoidance of NSAIDs and maintain soft stool to reduce straining.  Primary hypertension BP stable, OP regimen includes amlodipine  10 mg, chlorthalidone  25 mg, and losartan  100 mg. -Will check BMP. Peripheral polyneuropathy Stable on gabapentin , refills sent to pharmacy. Cirrhosis of liver without ascites, unspecified hepatic cirrhosis type (HCC) Follows with GI, RUQ 2024 showed hepatic steatosis. Encounter for immunization Flu vaccine administered at this visit.   Orders Placed This Encounter  Procedures   Flu vaccine trivalent PF, 6mos and older(Flulaval,Afluria,Fluarix,Fluzone)   CBC   Basic metabolic panel with GFR    Missy Sandhoff, MD [facility] Internal Medicine, PGY-2  Date 11/05/2023 Time 2:50 PM

## 2023-11-05 NOTE — Assessment & Plan Note (Addendum)
 Follows with GI, RUQ 2024 showed hepatic steatosis.

## 2023-11-06 ENCOUNTER — Ambulatory Visit: Payer: Self-pay | Admitting: Student

## 2023-11-06 ENCOUNTER — Other Ambulatory Visit: Payer: Self-pay | Admitting: Student

## 2023-11-06 DIAGNOSIS — E876 Hypokalemia: Secondary | ICD-10-CM

## 2023-11-06 LAB — BASIC METABOLIC PANEL WITH GFR
BUN/Creatinine Ratio: 7 — ABNORMAL LOW (ref 9–23)
BUN: 6 mg/dL (ref 6–24)
CO2: 24 mmol/L (ref 20–29)
Calcium: 8.8 mg/dL (ref 8.7–10.2)
Chloride: 102 mmol/L (ref 96–106)
Creatinine, Ser: 0.91 mg/dL (ref 0.57–1.00)
Glucose: 95 mg/dL (ref 70–99)
Potassium: 2.5 mmol/L — ABNORMAL LOW (ref 3.5–5.2)
Sodium: 143 mmol/L (ref 134–144)
eGFR: 73 mL/min/1.73 (ref >59)

## 2023-11-06 LAB — CBC
Hematocrit: 31 % — ABNORMAL LOW (ref 34.0–46.6)
Hemoglobin: 11.1 g/dL (ref 11.1–15.9)
MCH: 34.2 pg — ABNORMAL HIGH (ref 26.6–33.0)
MCHC: 35.8 g/dL — ABNORMAL HIGH (ref 31.5–35.7)
MCV: 95 fL (ref 79–97)
Platelets: 123 x10E3/uL — ABNORMAL LOW (ref 150–450)
RBC: 3.25 x10E6/uL — ABNORMAL LOW (ref 3.77–5.28)
RDW: 14.1 % (ref 11.7–15.4)
WBC: 7.5 x10E3/uL (ref 3.4–10.8)

## 2023-11-06 MED ORDER — POTASSIUM CHLORIDE CRYS ER 20 MEQ PO TBCR: 40.0000 meq | EXTENDED_RELEASE_TABLET | Freq: Four times a day (QID) | ORAL | 0 refills | Status: AC

## 2023-11-06 NOTE — Progress Notes (Addendum)
 BMP with hypokalemia; K2.5.  Denies any weakness, fatigue or muscle cramps.  She is on chlorthalidone  which can cause hypokalemia;(has been on this medicine since 2023). Will hold chlorthalidone  starting today 10/28, will replete with 2 doses of KCl 40 mg.  Patient will follow-up for lab only visit on 10/30 to check BMP,  and Mg, and if K is stable, will resume chlorthalidone .   - Could consider in the future potassium sparing diuretic like spironolactone/amiloride, if hypokalemia is recurrent/persistent.  I have communicated the results to the patient. [facility] [address]

## 2023-11-07 NOTE — Telephone Encounter (Signed)
 Called pt - no answer. Left message on self-identified vm to take her medications as discussed and schedule a lab appt on Monday 11/3.

## 2023-11-07 NOTE — Telephone Encounter (Signed)
 Copied from CRM [id number]. Topic: Clinical - Prescription Issue >> Nov 07, 2023  8:49 AM Cherylann RAMAN wrote: Reason for CRM: Patient is supposed to come in on 10/30 to complete labs however the medication was not ready for pick up and will not be ready until sometime today 10/29. Patient would be able to come in on Friday to complete labs after taking medication for specified amount of time that the provider has ordered.

## 2023-11-07 NOTE — Telephone Encounter (Signed)
 Is it ok for pt to come Friday for labs?

## 2023-11-07 NOTE — Telephone Encounter (Addendum)
 Pt stated she will pick up Klor-con today after work; take 2 tabs this afternoon and 2 tomorrow then come in for labs at [facility]. I told pt the lab closes @ 1130 AM on Fridays. Stated she will not be able to come ion Friday am. I will ask Dr Celestina if it's ok for her to come Monday 11/3.

## 2023-11-08 ENCOUNTER — Telehealth: Payer: Self-pay | Admitting: *Deleted

## 2023-11-08 NOTE — Progress Notes (Signed)
 Internal Medicine Clinic Attending  Case discussed with the resident at the time of the visit.  We reviewed the resident's history and exam and pertinent patient test results.  I agree with the assessment, diagnosis, and plan of care documented in the resident's note.

## 2023-11-08 NOTE — Telephone Encounter (Signed)
 RTC to patient confirmed with her to take the Potassium pills over there weekend and to come in on Monday for he lab work.                                      Copied from CRM #[id number]. Topic: Clinical - Medical Advice >> Nov 08, 2023  9:23 AM Cherylann RAMAN wrote: Reason for CRM: Patient called to ask if Dr. Julius K. Would like for her to begin taking the potassium chloride SA (KLOR-CON M) 20 MEQ tablet on Saturday and Sunday and then come in on Monday to have labs completed. As she has just picked up her prescription on 10/29. Please Advise. Patient can't be contacted at [phone].

## 2023-11-12 ENCOUNTER — Telehealth: Payer: Self-pay | Admitting: *Deleted

## 2023-11-12 ENCOUNTER — Encounter: Payer: Self-pay | Admitting: *Deleted

## 2023-11-12 NOTE — Telephone Encounter (Signed)
 Called pt - no answer; mailbox is full, unable to leave a message. I will send a My Chart message to pt.

## 2023-11-12 NOTE — Telephone Encounter (Signed)
 I will ask Courtney Valdez to look at the additional labs.

## 2023-11-12 NOTE — Telephone Encounter (Signed)
 Copied from CRM #[id number]. Topic: Clinical - Request for Lab/Test Order >> Nov 12, 2023  2:21 PM Courtney Valdez wrote: Reason for CRM: Patient called. Would like to come tomorrow instead of today for lab. Scheduled appt. Patient states Transplant provider from [facility] states they also have orders in [facility] should be able to access. Would like to know if she can have those done there at the same time. Thank You    ----------------------------------------------------------------------- From previous Reason for Contact - Lab/Test Results: Reason for CRM: Patient called. Would like to come tomorrow instead of today for lab. Scheduled appt. Patient states Transplant provider from [facility] states they also have orders in [facility] should be able to access. Would like to know if she can have those done there at the same time. Thank You    ----------------------------------------------------------------------- From previous Reason for Contact - Cancel/Reschedule: Patient/patient representative is calling to cancel or reschedule an appointment. Refer to attachments for appointment information.

## 2023-11-13 ENCOUNTER — Other Ambulatory Visit: Payer: Self-pay

## 2023-11-13 DIAGNOSIS — E876 Hypokalemia: Secondary | ICD-10-CM

## 2023-11-14 ENCOUNTER — Ambulatory Visit: Payer: Self-pay | Admitting: Student

## 2023-11-14 ENCOUNTER — Other Ambulatory Visit: Payer: Self-pay | Admitting: Student

## 2023-11-14 LAB — MAGNESIUM: Magnesium: 0.9 mg/dL — CL (ref 1.6–2.3)

## 2023-11-14 MED ORDER — MAGNESIUM 400 MG PO TABS: 400.0000 mg | ORAL_TABLET | Freq: Three times a day (TID) | ORAL | 0 refills | Status: AC

## 2023-11-14 MED ORDER — POTASSIUM CHLORIDE CRYS ER 20 MEQ PO TBCR: 20.0000 meq | EXTENDED_RELEASE_TABLET | Freq: Three times a day (TID) | ORAL | 0 refills | Status: AC

## 2023-11-14 MED ORDER — MAGNESIUM SULFATE 50 % IJ SOLN: 2.0000 g | Freq: Once | INTRAVENOUS | 0 refills | Status: AC

## 2023-11-14 NOTE — Addendum Note (Signed)
 Addended by: CELESTINA CZAR on: 11/14/2023 04:59 PM   Modules accepted: Orders

## 2023-11-14 NOTE — Progress Notes (Signed)
 Will replete Mg with PO Mg tabs, while awaiting for IV supplementation.  Will do 20 KCl TID.  After adequate supplementation, will monitor Ca and K with BMP, and Mg levels

## 2023-11-14 NOTE — Progress Notes (Signed)
 Severe hypomagnesemia, asymptomatic, etiology likely due to diarrhea, alcohol use, and hypokalemia. She will start oral magnesium wit po Mg 400 BID while awaiting IV repletion at the infusion center, as discussed with Dr. Shawn.  She was advised to go to the ED if symptoms develop weakness, or palpitation; or if  she does not hear from the infusion center within 48 hours. Follow-up labs will be checked in 3-5 days.  I have relayed this information to patient, she understands the plan, and will pick up the p.o. magnesium and potassium today.

## 2023-11-14 NOTE — Progress Notes (Signed)
 Multiple attempts were made to reach the patient to reemphasize the treatment plan; however, the patient was unavailable, and the voicemail box is full. Given the markedly low magnesium level, I will send a prescription for oral magnesium 400 mg BID  to the pharmacy for the patient to pick up today and KCl 20 TID. Additionally, I will arrange for IV magnesium supplementation through the infusion center. Oral potassium supplementation will also be sent to the pharmacy.

## 2023-11-14 NOTE — Addendum Note (Signed)
 Addended by: CELESTINA CZAR on: 11/14/2023 04:52 PM   Modules accepted: Orders

## 2023-11-15 ENCOUNTER — Telehealth: Payer: Self-pay | Admitting: Student

## 2023-11-15 ENCOUNTER — Other Ambulatory Visit (HOSPITAL_COMMUNITY): Payer: Self-pay | Admitting: Student

## 2023-11-15 NOTE — Telephone Encounter (Signed)
 Spoke with Ms. Courtney Valdez regarding her oral magnesium and potassium prescriptions sent to the pharmacy for severe hypomagnesemia. She has not yet picked up the medications but states she will do so today (11/6).   Reinforced that low magnesium increases the risk for weakness and seizures, and emphasized the importance of starting both medications today.  Patient was referred to [facility] for IV magnesium; she reports she has not yet been contacted. Will follow up with the infusion center to confirm scheduling.

## 2023-11-16 ENCOUNTER — Ambulatory Visit (HOSPITAL_COMMUNITY)
Admission: RE | Admit: 2023-11-16 | Discharge: 2023-11-16 | Disposition: A | Discharge status: Home / Self Care | Source: Ambulatory Visit | Attending: Internal Medicine | Admitting: Internal Medicine

## 2023-11-16 MED ORDER — MAGNESIUM SULFATE 2 GM/50ML IV SOLN
2.0000 g | Freq: Once | INTRAVENOUS | Status: AC
  Administered 2023-11-16: 2 g via INTRAVENOUS
  Filled 2023-11-16: qty 50

## 2023-11-26 ENCOUNTER — Telehealth: Payer: Self-pay | Admitting: *Deleted

## 2023-11-26 NOTE — Telephone Encounter (Signed)
 Copied from CRM #[id number]. Topic: Clinical - Medical Advice >> Nov 26, 2023  2:25 PM Merlynn A wrote: Reason for CRM: Patient called in requesting call back from Dr. Reginold. Please contact patient at [phone].

## 2023-11-26 NOTE — Telephone Encounter (Signed)
 I called pt who stated someone had called her about her magnesium level. I do not see a telephone encounter. She wanted to know if Dr Celestina had called her.

## 2023-11-27 NOTE — Telephone Encounter (Signed)
 Copied from CRM #[id number]. Topic: Clinical - Lab/Test Results >> Nov 27, 2023  9:04 AM Graeme ORN wrote: Reason for CRM: Patient  Called. States had infusion and took potassium that was prescribed and was retested and provider who reviewed the tests states potassium is still too low and she should reach out to primary care. Would like to know how the provider would like to proceed. Thank You

## 2023-11-28 NOTE — Telephone Encounter (Signed)
 Called pt - no answer; left message on self-identified vm to continue taking her potassium med and the doctor wants to re-check labs on Friday; and to call the office back to schedule a lab only appt.

## 2023-11-29 NOTE — Telephone Encounter (Signed)
 Talked to pt who stated she has taken all the potassium pills and wants to know should she be taking them every day, if so, need another rx. Pt stated she cannot come Friday morning for lab, has to be in the afternoon. She can come Monday 11/24 afternoon.

## 2023-12-03 ENCOUNTER — Other Ambulatory Visit

## 2023-12-04 ENCOUNTER — Telehealth: Payer: Self-pay | Admitting: Student

## 2023-12-04 LAB — BASIC METABOLIC PANEL WITH GFR
BUN/Creatinine Ratio: 10 (ref 9–23)
BUN: 7 mg/dL (ref 6–24)
CO2: 21 mmol/L (ref 20–29)
Calcium: 9.7 mg/dL (ref 8.7–10.2)
Chloride: 97 mmol/L (ref 96–106)
Creatinine, Ser: 0.68 mg/dL (ref 0.57–1.00)
Glucose: 106 mg/dL — ABNORMAL HIGH (ref 70–99)
Potassium: 3 mmol/L — ABNORMAL LOW (ref 3.5–5.2)
Sodium: 139 mmol/L (ref 134–144)
eGFR: 100 mL/min/1.73 (ref >59)

## 2023-12-04 LAB — MAGNESIUM: Magnesium: 0.6 mg/dL — CL (ref 1.6–2.3)

## 2023-12-04 MED ORDER — LOPERAMIDE HCL 2 MG PO TABS: 2.0000 mg | ORAL_TABLET | Freq: Three times a day (TID) | ORAL | 0 refills | Status: AC | PRN

## 2023-12-04 NOTE — Telephone Encounter (Signed)
     I called Ms. Deiter to discuss her recent laboratory results, which showed hypokalemia (K 3.0) and severe hypomagnesemia (Mg 0.6). Notably, she had recently received a magnesium  infusion and multiple doses of potassium prior to this lab draw.  On further questioning, she reported experiencing loose stools 2-3 times daily over the past couple of weeks. With normal kidney function and no evidence of metabolic acidosis, her electrolyte losses are unlikely to be due to renal tubular acidosis. The clinical picture is more consistent with gastrointestinal electrolyte loss secondary to diarrhea. She denies any prior abdominal surgeries or history of malabsorption disorders.  I recommended that she present to the emergency department for IV magnesium  repletion as well as IV potassium. However, the patient stated she is unable to go to the ED at this time due to preparing for holiday travel. We reviewed the importance of correcting her electrolytes to prevent complications such as weakness, seizures, myalgias, and cardiac arrhythmias. These risks were discussed thoroughly, and the patient verbalized understanding.  In the meantime, I will prescribe loperamide  (Imodium ) to help control her diarrhea and advised her to increase potassium-rich foods, such as bananas. I also explained that her hypomagnesemia must be corrected in order for her potassium level to improve. We discussed signs and symptoms of worsening hypomagnesemia and hypokalemia that should prompt immediate evaluation at an urgent care center or emergency room while she is traveling.  Drue Lisa Grow MD 12/04/2023, 9:56 AM

## 2024-01-08 ENCOUNTER — Other Ambulatory Visit

## 2024-01-18 ENCOUNTER — Ambulatory Visit
